# Patient Record
Sex: Female | Born: 1955 | Race: White | Hispanic: No | Marital: Married | Smoking: Never smoker
Health system: Southern US, Community
[De-identification: ages and names within clinical notes are randomized; demographics above are authoritative.]

## PROBLEM LIST (undated history)

## (undated) DIAGNOSIS — K5792 Diverticulitis of intestine, part unspecified, without perforation or abscess without bleeding: Secondary | ICD-10-CM

## (undated) DIAGNOSIS — N289 Disorder of kidney and ureter, unspecified: Secondary | ICD-10-CM

## (undated) DIAGNOSIS — I10 Essential (primary) hypertension: Secondary | ICD-10-CM

## (undated) HISTORY — PX: BREAST EXCISIONAL BIOPSY: SUR124

## (undated) HISTORY — PX: COLON SURGERY: SHX602

---

## 1997-09-02 ENCOUNTER — Ambulatory Visit (HOSPITAL_COMMUNITY): Admission: RE | Admit: 1997-09-02 | Discharge: 1997-09-02 | Payer: Self-pay | Admitting: *Deleted

## 1999-07-18 ENCOUNTER — Other Ambulatory Visit: Admission: RE | Admit: 1999-07-18 | Discharge: 1999-07-18 | Payer: Self-pay | Admitting: Obstetrics & Gynecology

## 2000-08-11 ENCOUNTER — Other Ambulatory Visit: Admission: RE | Admit: 2000-08-11 | Discharge: 2000-08-11 | Payer: Self-pay | Admitting: Obstetrics & Gynecology

## 2001-08-11 ENCOUNTER — Encounter: Payer: Self-pay | Admitting: General Surgery

## 2001-08-11 ENCOUNTER — Other Ambulatory Visit: Admission: RE | Admit: 2001-08-11 | Discharge: 2001-08-11 | Payer: Self-pay | Admitting: Obstetrics & Gynecology

## 2001-08-11 ENCOUNTER — Ambulatory Visit (HOSPITAL_COMMUNITY): Admission: RE | Admit: 2001-08-11 | Discharge: 2001-08-11 | Payer: Self-pay | Admitting: General Surgery

## 2001-09-09 ENCOUNTER — Encounter (INDEPENDENT_AMBULATORY_CARE_PROVIDER_SITE_OTHER): Payer: Self-pay | Admitting: Specialist

## 2001-09-09 ENCOUNTER — Inpatient Hospital Stay (HOSPITAL_COMMUNITY): Admission: RE | Admit: 2001-09-09 | Discharge: 2001-09-16 | Payer: Self-pay | Admitting: General Surgery

## 2002-08-26 ENCOUNTER — Other Ambulatory Visit: Admission: RE | Admit: 2002-08-26 | Discharge: 2002-08-26 | Payer: Self-pay | Admitting: Obstetrics & Gynecology

## 2003-09-07 ENCOUNTER — Other Ambulatory Visit: Admission: RE | Admit: 2003-09-07 | Discharge: 2003-09-07 | Payer: Self-pay | Admitting: Obstetrics & Gynecology

## 2004-10-02 ENCOUNTER — Other Ambulatory Visit: Admission: RE | Admit: 2004-10-02 | Discharge: 2004-10-02 | Payer: Self-pay | Admitting: Obstetrics & Gynecology

## 2006-10-08 ENCOUNTER — Ambulatory Visit: Payer: Self-pay | Admitting: Orthopedic Surgery

## 2010-07-27 NOTE — H&P (Signed)
[facility]  Patient:    Melinda Stevens, Melinda Stevens Visit Number: [id number] MRN: [id number]          Service Type: SUR Location: 3W 6213 01 Attending Physician:  Carson Myrtle Dictated by:   Sheppard Plumber Earlene Plater, M.D. Admit Date:  09/09/2001                           History and Physical  ADMITTING DIAGNOSIS:  Chronic recurrent diverticulitis, pandiverticulosis.  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  She has seen her physician at home.  She has seen a gastroenterologist in the remote past.  She is very stoic.  She is very interested in her life, living, and her work, and she chooses not to be sick nor take time off.  She has had two hospitalizations with diverticulitis in the past 10 years, and she has had approximately four bouts of diverticulitis each year for the past five years treated as an outpatient.  Usually she alters her diet, goes to clear liquids, and takes antibiotics with good resolution in three to five days.  I have only seen the patient twice during these bouts of diverticulitis and treated her personally. She does have a current barium enema done at [facility] read by Dr. Onalee Hua Call, which shows diverticulosis, no evidence of acute diverticulitis. The date of that study was June 23, 2001.  The patient has been scheduled for this surgery for the past three months and for one or another reason has rescheduled the surgery.  She does understand diverticulitis.  She does understand the potential complications of acute diverticulitis, and she understands the surgery for removal of the inflamed portion of the colon.  We have had many discussions about the surgery that she is now ready to undertake.  PAST MEDICAL HISTORY:  Her past history really is unremarkable.  She has had  a vaginal hysterectomy and no other surgery except for a breast mass.  She is a gravida 1, para 1.  MEDICATIONS:  She takes no medications except for multiple vitamin and Fibercon supplement.  ALLERGIES:  She has no allergies.  SOCIAL HISTORY:  She does not smoke.  She does not drink.  FAMILY HISTORY:  Her father is living with diabetes.  Her mother lives, with hypertension.  One brother and two sisters are alive and well.  REVIEW OF SYSTEMS:  She denies constitutional, ENT, cardiovascular, pulmonary problems.  She complains of some stress incontinence with her urine.  Breasts are said to be normal and mammograms current.  She has no joint or muscle pains.  PHYSICAL EXAMINATION:  VITAL SIGNS:  Her vital signs are normal at 98.2, 72, 16, 108/70.  GENERAL:  Her physical exam is remarkable for the fact that she is overweight at 180 pounds, 5 feet 5 inches.  HEENT:  Negative.  NECK:  Free of masses.  CHEST:  Clear to auscultation.  CARDIAC:  The heart rhythm is regular.  BREASTS:  Not examined at this time.  ABDOMEN:  Full, obese, and there are no scars or marks.  Palpation of the left lower quadrant is slightly uncomfortable, and there is no mass effect.  PLAN:  Her laboratory data have been reviewed, her barium enema has been reviewed, and we are all in agreement to proceed with  this surgery, which is removal of her colon involved with diverticulosis and diverticulitis. Dictated by:   Sheppard Plumber Earlene Plater, M.D. Attending Physician:  Carson Myrtle DD:  09/10/01 TD:  09/14/01 Job: [id number]

## 2010-07-27 NOTE — Op Note (Signed)
[facility]  Patient:    Melinda Stevens, Melinda Stevens Visit Number: [id number] MRN: [id number]          Service Type: SUR Location: 3W 4010 01 Attending Physician:  Carson Myrtle Dictated by:   Sheppard Plumber Earlene Plater, M.D. Proc. Date: 09/09/01 Admit Date:  09/09/2001                             Operative Report  PREOPERATIVE DIAGNOSIS:  Chronic recurrent diverticulitis, chronic pancolonic diverticulosis.  POSTOPERATIVE DIAGNOSES: 1. Chronic recurrent diverticulitis, chronic pancolonic diverticulosis. 2. Left ovarian hemorrhagic cyst.  OPERATION: 1. Exploratory laparotomy. 2. Total abdominal colectomy with ileoproctostomy. 3. Excision of left hemorrhagic cyst of the ovary.  SURGEON:  Timothy E. Earlene Plater, M.D.  ASSISTANT:  Donnie Coffin. Samuella Cota, M.D.  ANESTHESIA:  CRNA supervised Ninfa Meeker, M.D.  INDICATION FOR PROCEDURE:  Please see the history and physical for details. Mrs. Marland is operated today at a time of her choice for chronic recurrent diverticulitis and known pancolonic diverticulosis.  She was prepared at home. She was stable upon admission to the preoperative area.  She was evaluated by anesthesia, properly identified and the permit checked and was signed properly.  DESCRIPTION OF PROCEDURE:  She was taken to the operating room, placed supine, general endotracheal anesthesia administered.  A nasogastric tube inserted. Foley catheter inserted.  PAS hose applied.  The abdomen was prepped and draped in the usual fashion.  A short lower midline incision was used.  The abdominal cavity was entered.  There were no apparent adhesions.  The small bowel was inspected and as we began to run the small bowel, it was apparent the distal small bowel was densely adherent to the vaginal cuff and pelvic area.  This was carefully dissected by sharp dissection and the small bowel delivered into the wound.  This was the distal ileum.  The bowel was uninjured and  intact.  In addition, there were a number of other adhesions in the pelvic, mainly acute filmy adhesions of the sigmoid colon to the ovary and pelvic sidewalls and dense chronic adhesions of the rectosigmoid to the pelvic sidewall and left ovary.  The left ovary contained a large hemorrhagic cyst that was in fact bleeding prior to any dissection.  That cyst was inspected, excised.  The tissue submitted for pathology and the cyst wall oversewn with a running 3-0 Vicryl suture.  The dense adhesions of the rectosigmoid were taken down sharply.  The sigmoid colon and descending colon had the appearance and feel of a burned out or so called stove pipe colon of colitis or chronic diverticulitis.  We then extended the incision to the right and above the umbilicus in order to explore the colon further.  By palpation this involvement of the colon extended to the splenic flexure.  We began to take down the lateral peritoneal attachments of the left colon up to the splenic flexure, around the splenic flexure and were able to then further palpate other diverticula of the transfer colon.  Therefore, we extended the incision again to a full long midline incision.  The colon was then further explored. Large wide mouth inspissated diverticula were present in the mid transverse colon, the hepatic flexure and the right colon.  We then proceeded with the dissection, dividing the layers of the omentum superior to the colon and delivered the splenic flexure well into the wound.  We intended to divide the colon at the mid  transverse area so that the mid transverse chronic diverticula could be removed.  We divided the colon at that point between clamps.  We divided the rectum at its upper portion between clamps and then further were able to dissect and deliver the left colon, splenic flexure and transverse colon into the wound.  As we did that, it became apparent that the proximal transverse colon and hepatic  flexure was not going to reach down into the pelvic for the anastomosis, so we divided the superior and inferior peritoneum of the transverse and hepatic flexure in order to gain some flexibility for the anastomosis.  As we did so, the short ascending colon and the foreshortened mesentry of the transverse colon made it apparent that the entire colon had been involved at some time with diverticulitis and that the diverticula of the right colon also were at risk being wide mouthed and full of inspissated bowel that a total abdominal colectomy was in order.  As well only a short portion of the ascending colon could have been salvaged for delivery into the pelvis for the anastomosis.  So we made that intraoperative decision and continued to divide the peritoneal attachments of the entire colon.  We then divided the ileum at the ileocecal junction between clamps which of course included the appendix with the cecum and then proceeded to remove the entire colon by dividing the mesentry between clamps and passing the entire colon off the field in two specimens.  The first being the sigmoid, descending and transverse colon.  The second specimen being the remainder of transverse and ascending colon.  These were sent fresh to the laboratory and carefully identified on the phone.  This left then, the terminal ileum which was well mobile in the right lower quadrant.  All areas of the mesentry were carefully checked as well the splenic bed.  No bleeding was apparent. The areas were irrigated and then packed off.  The ileum easily reached the upper rectum and the clamps were removed.  There was no spillage and an open end-to-end anastomosis was created between the ileum and the upper rectum in a one layer hand sewn fashion with 3-0 interrupted silk.  The small mesenteric defect was closed with interrupted silk.  Again the pelvis was carefully inspected, irrigated.  All areas of dissection were checked.   Nasogastric tube was positioned and the procedure was complete.  All sponge and instrument counts were correct.  Estimated blood loss at this point was between 300 and  400 cc.  The patient was stable and no replacement was given except for crystalloid fluid.  With the counts correct, the remaining viscera were placed in their usual and anatomic position.  The omentum was drawn down over the small bowel and the abdomen was closed in a single layer with #1 PDS sutures. The subcutaneous was copiously irrigated and the skin closed with wide skin staples. Final counts were correct.  The patient was stable and she was extubated and removed to the recovery room in good condition. Dictated by:   Sheppard Plumber Earlene Plater, M.D. Attending Physician:  Carson Myrtle DD:  09/09/01 TD:  09/13/01 Job: [id number]

## 2010-07-27 NOTE — Discharge Summary (Signed)
[facility]  Patient:    Melinda Stevens, Melinda Stevens Visit Number: [id number] MRN: [id number]          Service Type: SUR Location: 3W 6213 01 Attending Physician:  Carson Myrtle Dictated by:   Sheppard Plumber Earlene Plater, M.D. Admit Date:  09/09/2001 Discharge Date: 09/16/2001                             Discharge Summary  DISCHARGE DIAGNOSES: 1. Pandiverticulosis. 2. Diverticulitis.  PROCEDURE:  Total abdominal colectomy with ileoproctostomy on September 09, 2001.  HISTORY OF PRESENT ILLNESS:  For details of admission, please see the enclosed notes.  HOSPITAL COURSE:  The patient was prepared for the surgery at home.  She was seen and evaluated on the day of surgery.  She was taken to the operating room where a total abdominal colectomy and ileoproctostomy was accomplished because of pandiverticulosis and diverticulitis.  She had an excellent postoperative recuperation without the first complication.  She progressed well physically, as well as mentally and emotionally.  Her bowel action returned on the third and fourth day.  She was able to tolerate a diet advancing to normal with the expected amount of stool action and diarrhea.  She had no fever, no complications and no infections.  The wound remained primary.  Staples were out.  Steri-Strips were applied.  Careful instructions were given to the patient in regards to activity, diet and regulation of diarrhea.  Her final pathology report did show pandiverticulosis and acute diverticulitis.  She was given Percocet 5 mg, #36 and will be followed closely in the hospital.  LABORATORY DATA AND X-RAY FINDINGS:  Preoperative hemoglobin of 14, postoperative 13.  Other laboratory data satisfactory with slight elevation of glucose followed with IV fluids. Dictated by:   Sheppard Plumber Earlene Plater, M.D. Attending Physician:  Carson Myrtle DD:  09/16/01 TD:  09/19/01 Job: [id number]

## 2013-08-23 ENCOUNTER — Other Ambulatory Visit: Payer: Self-pay | Admitting: Obstetrics & Gynecology

## 2013-09-21 ENCOUNTER — Other Ambulatory Visit: Payer: Self-pay | Admitting: Obstetrics & Gynecology

## 2014-08-15 ENCOUNTER — Other Ambulatory Visit: Payer: Self-pay | Admitting: Obstetrics & Gynecology

## 2014-08-16 LAB — CYTOLOGY - PAP

## 2016-05-02 ENCOUNTER — Emergency Department (HOSPITAL_COMMUNITY): Payer: BLUE CROSS/BLUE SHIELD

## 2016-05-02 ENCOUNTER — Emergency Department (HOSPITAL_COMMUNITY)
Admission: EM | Admit: 2016-05-02 | Discharge: 2016-05-02 | Disposition: A | Discharge status: Home / Self Care | Payer: BLUE CROSS/BLUE SHIELD | Attending: Emergency Medicine | Admitting: Emergency Medicine

## 2016-05-02 ENCOUNTER — Encounter (HOSPITAL_COMMUNITY): Payer: Self-pay | Admitting: Emergency Medicine

## 2016-05-02 DIAGNOSIS — J181 Lobar pneumonia, unspecified organism: Secondary | ICD-10-CM | POA: Diagnosis not present

## 2016-05-02 DIAGNOSIS — R1011 Right upper quadrant pain: Secondary | ICD-10-CM | POA: Diagnosis present

## 2016-05-02 DIAGNOSIS — I10 Essential (primary) hypertension: Secondary | ICD-10-CM | POA: Diagnosis not present

## 2016-05-02 DIAGNOSIS — Z7982 Long term (current) use of aspirin: Secondary | ICD-10-CM | POA: Insufficient documentation

## 2016-05-02 DIAGNOSIS — J189 Pneumonia, unspecified organism: Secondary | ICD-10-CM

## 2016-05-02 HISTORY — DX: Diverticulitis of intestine, part unspecified, without perforation or abscess without bleeding: K57.92

## 2016-05-02 HISTORY — DX: Essential (primary) hypertension: I10

## 2016-05-02 HISTORY — DX: Disorder of kidney and ureter, unspecified: N28.9

## 2016-05-02 LAB — COMPREHENSIVE METABOLIC PANEL
ALT: 34 U/L (ref 14–54)
AST: 25 U/L (ref 15–41)
Albumin: 3.6 g/dL (ref 3.5–5.0)
Alkaline Phosphatase: 45 U/L (ref 38–126)
Anion gap: 8 (ref 5–15)
BUN: 9 mg/dL (ref 6–20)
CHLORIDE: 104 mmol/L (ref 101–111)
CO2: 27 mmol/L (ref 22–32)
CREATININE: 0.79 mg/dL (ref 0.44–1.00)
Calcium: 8.6 mg/dL — ABNORMAL LOW (ref 8.9–10.3)
GFR calc Af Amer: >60 mL/min (ref >60)
GFR calc non Af Amer: >60 mL/min (ref >60)
Glucose, Bld: 96 mg/dL (ref 65–99)
Potassium: 3.6 mmol/L (ref 3.5–5.1)
SODIUM: 139 mmol/L (ref 135–145)
Total Bilirubin: 0.5 mg/dL (ref 0.3–1.2)
Total Protein: 6 g/dL — ABNORMAL LOW (ref 6.5–8.1)

## 2016-05-02 LAB — CBC WITH DIFFERENTIAL/PLATELET
Basophils Absolute: 0.1 10*3/uL (ref 0.0–0.1)
Basophils Relative: 0 %
EOS ABS: 0.3 10*3/uL (ref 0.0–0.7)
EOS PCT: 2 %
HCT: 38.8 % (ref 36.0–46.0)
HEMOGLOBIN: 12.8 g/dL (ref 12.0–15.0)
LYMPHS ABS: 2.1 10*3/uL (ref 0.7–4.0)
Lymphocytes Relative: 17 %
MCH: 29.4 pg (ref 26.0–34.0)
MCHC: 33 g/dL (ref 30.0–36.0)
MCV: 89.2 fL (ref 78.0–100.0)
MONO ABS: 1 10*3/uL (ref 0.1–1.0)
MONOS PCT: 8 %
NEUTROS PCT: 73 %
Neutro Abs: 8.7 10*3/uL — ABNORMAL HIGH (ref 1.7–7.7)
Platelets: 211 10*3/uL (ref 150–400)
RBC: 4.35 MIL/uL (ref 3.87–5.11)
RDW: 13.7 % (ref 11.5–15.5)
WBC: 12.1 10*3/uL — ABNORMAL HIGH (ref 4.0–10.5)

## 2016-05-02 LAB — URINALYSIS, ROUTINE W REFLEX MICROSCOPIC
Bacteria, UA: NONE SEEN
Bilirubin Urine: NEGATIVE
GLUCOSE, UA: NEGATIVE mg/dL
Hgb urine dipstick: NEGATIVE
Ketones, ur: NEGATIVE mg/dL
Nitrite: NEGATIVE
PROTEIN: NEGATIVE mg/dL
Specific Gravity, Urine: 1.029 (ref 1.005–1.030)
pH: 6 (ref 5.0–8.0)

## 2016-05-02 LAB — LIPASE, BLOOD: Lipase: 24 U/L (ref 11–51)

## 2016-05-02 MED ORDER — HYDROMORPHONE HCL 2 MG/ML IJ SOLN
0.5000 mg | Freq: Once | INTRAMUSCULAR | Status: AC
  Administered 2016-05-02: 0.5 mg via INTRAVENOUS
  Filled 2016-05-02: qty 1

## 2016-05-02 MED ORDER — AZITHROMYCIN 250 MG PO TABS: 250.0000 mg | ORAL_TABLET | Freq: Every day | ORAL | 0 refills | Status: DC

## 2016-05-02 MED ORDER — IOPAMIDOL (ISOVUE-370) INJECTION 76%
INTRAVENOUS | Status: AC
  Administered 2016-05-02: 100 mL via INTRAVENOUS
  Filled 2016-05-02: qty 100

## 2016-05-02 MED ORDER — ONDANSETRON HCL 4 MG/2ML IJ SOLN
4.0000 mg | Freq: Once | INTRAMUSCULAR | Status: AC
  Administered 2016-05-02: 4 mg via INTRAVENOUS
  Filled 2016-05-02: qty 2

## 2016-05-02 MED ORDER — SODIUM CHLORIDE 0.9 % IV SOLN
Freq: Once | INTRAVENOUS | Status: AC
  Administered 2016-05-02: 11:00:00 via INTRAVENOUS

## 2016-05-02 NOTE — ED Triage Notes (Signed)
Seen  At  [facility] last night dx with renal mass after c/o abd pain x 3 days  Sent home with antiobiotic and they wanted to  Admit her but she wanted to be at [facility]

## 2016-05-02 NOTE — ED Provider Notes (Signed)
[place] DEPT Provider Note   CSN: [id number] Arrival date & time: 05/02/16  [id number]     History   Chief Complaint Chief Complaint  Patient presents with  . Abdominal Pain    HPI Melinda Stevens is a 61 y.o. female.  HPI 61 year old female who presents with right upper quadrant abdominal pain. She has a history of perforated diverticulitis status post colectomy, CK D, hypertension, abdominal hysterectomy, and tubal ligation. States sudden onset of right upper quadrant abdominal pain and flank pain 3 days ago, has been gradually worsening, and constant. It is associated with fever with MAXIMUM TEMPERATURE of 103 Fahrenheit for which she has been taking Tylenol for, to good effect. Not associated with vomiting, diarrhea, dysuria or urinary frequency. She has had mild cough, unchanged from her chronic cough and no shortness of breath. Past Medical History:  Diagnosis Date  . Diverticulitis   . Hypertension   . Renal disorder     There are no active problems to display for this patient.   No past surgical history on file.  OB History    No data available       Home Medications    Prior to Admission medications   Medication Sig Start Date End Date Taking? Authorizing Provider  aspirin EC 81 MG tablet Take 81 mg by mouth daily.   Yes Historical Provider, MD  lisinopril-hydrochlorothiazide (PRINZIDE,ZESTORETIC) 20-12.5 MG tablet Take 1 tablet by mouth daily.   Yes Historical Provider, MD  azithromycin (ZITHROMAX) 250 MG tablet Take 1 tablet (250 mg total) by mouth daily. Take first 2 tablets together, then 1 every day until finished. 05/02/16   Forde Dandy, MD    Family History No family history on file.  Social History Social History  Substance Use Topics  . Smoking status: Never Smoker  . Smokeless tobacco: Never Used  . Alcohol use Not on file     Allergies   Patient has no known allergies.   Review of Systems Review of Systems 10/14 systems reviewed and  are negative other than those stated in the HPI   Physical Exam Updated Vital Signs BP 130/72   Pulse 89   Temp 100.1 F (37.8 C) (Oral)   Resp 18   SpO2 94%   Physical Exam Physical Exam  Nursing note and vitals reviewed. Constitutional: Well developed, well nourished, non-toxic, and in no acute distress Head: Normocephalic and atraumatic.  Mouth/Throat: Oropharynx is clear and moist.  Neck: Normal range of motion. Neck supple.  Cardiovascular: Normal rate and regular rhythm.   Pulmonary/Chest: Effort normal and breath sounds normal.  Abdominal: Soft. No distension. There is RUQ and right flank tenderness. There is no rebound and no guarding.  Musculoskeletal: Normal range of motion.  Neurological: Alert, no facial droop, fluent speech, moves all extremities symmetrically Skin: Skin is warm and dry.  Psychiatric: Cooperative   ED Treatments / Results  Labs (all labs ordered are listed, but only abnormal results are displayed) Labs Reviewed  CBC WITH DIFFERENTIAL/PLATELET - Abnormal; Notable for the following:       Result Value   WBC 12.1 (*)    Neutro Abs 8.7 (*)    All other components within normal limits  URINALYSIS, ROUTINE W REFLEX MICROSCOPIC - Abnormal; Notable for the following:    APPearance HAZY (*)    Leukocytes, UA MODERATE (*)    Squamous Epithelial / LPF 0-5 (*)    Non Squamous Epithelial 6-30 (*)    All other components  within normal limits  COMPREHENSIVE METABOLIC PANEL - Abnormal; Notable for the following:    Calcium 8.6 (*)    Total Protein 6.0 (*)    All other components within normal limits  LIPASE, BLOOD    EKG  EKG Interpretation None       Radiology Dg Chest 2 View  Result Date: 05/02/2016 CLINICAL DATA:  Shortness of breath and cough over the last 3 days. EXAM: CHEST  2 VIEW COMPARISON:  06/13/2015 FINDINGS: Mild cardiomegaly. Aortic atherosclerosis. Chronic interstitial lung disease. Patchy infiltrate/atelectasis at the lung  bases right more than left not seen previously, consistent with mild pneumonia. No effusions. Bony structures unremarkable. IMPRESSION: Background pattern of chronic lung disease. New patchy density at the lung bases right more than left consistent with basilar pneumonia. Electronically Signed   By: Nelson Chimes M.D.   On: 05/02/2016 11:17   Ct Angio Chest Pe W And/or Wo Contrast  Result Date: 05/02/2016 CLINICAL DATA:  Chest pain and shortness of breath EXAM: CT ANGIOGRAPHY CHEST WITH CONTRAST TECHNIQUE: Multidetector CT imaging of the chest was performed using the standard protocol during bolus administration of intravenous contrast. Multiplanar CT image reconstructions and MIPs were obtained to evaluate the vascular anatomy. CONTRAST:  80 mL Isovue 370 nonionic COMPARISON:  Chest radiograph May 02, 2016 FINDINGS: Cardiovascular: There is no demonstrable pulmonary embolus. There is no thoracic aortic aneurysm or dissection. Visualized great vessels appear unremarkable. There is no appreciable pericardial thickening. Mediastinum/Nodes: Visualized thyroid appears normal. There are multiple subcentimeter mediastinal lymph nodes. There is a right hilar lymph node measuring 1.4 x 1.1 cm. There are several small sub- carinal lymph nodes. The largest of these sub- carinal lymph nodes measures 1.4 x 1.0 cm. There is an adjacent lymph node in the sub- carinal region measuring 1.2 x 1.0 cm. Lungs/Pleura: There is patchy airspace consolidation in the right middle lobe and in the left lower lobe in the anterior, lateral, and posterior segments, felt to represent foci of pneumonia. There is atelectasis with probable early pneumonia in the posterior right base as well. There is no pleural effusion or pleural thickening evident. Upper Abdomen: There is hepatic steatosis. Visualized upper abdominal structures otherwise appear unremarkable. Musculoskeletal: There is degenerative change in the thoracic spine. There are no  blastic or lytic bone lesions. Review of the MIP images confirms the above findings. IMPRESSION: No demonstrable pulmonary embolus. Patchy areas of pneumonia in the right middle lobe and left lower lobe and to a lesser extent in the right lower lobe posteriorly. Several mildly prominent lymph nodes present, likely of reactive etiology. Hepatic steatosis. Electronically Signed   By: Lowella Grip III M.D.   On: 05/02/2016 15:13   US Abdomen Limited Ruq  Result Date: 05/02/2016 CLINICAL DATA:  Right upper quadrant abdominal pain for the past 3 days. EXAM: US ABDOMEN LIMITED - RIGHT UPPER QUADRANT COMPARISON:  Abdominopelvic CT scan of May 01, 2016 FINDINGS: Gallbladder: The gallbladder is adequately distended with no evidence of stones, wall thickening, or pericholecystic fluid. There is no positive sonographic Murphy's sign Common bile duct: Diameter: 5 mm Liver: The hepatic echotexture is mildly increased diffusely. There is no focal mass or ductal dilation. The surface contour of the liver is smooth. IMPRESSION: No hepatic mass is observed on today's study. Increased hepatic echotexture is consistent with fatty infiltrative change. Normal appearance of the gallbladder and common bile duct. If there are clinical concerns of gallbladder dysfunction, a nuclear medicine hepatobiliary scan with gallbladder ejection fraction  determination may be useful. Electronically Signed   By: David  Martinique M.D.   On: 05/02/2016 11:37    Procedures Procedures (including critical care time)  Medications Ordered in ED Medications  HYDROmorphone (DILAUDID) injection 0.5 mg (0.5 mg Intravenous Given 05/02/16 1102)  ondansetron (ZOFRAN) injection 4 mg (4 mg Intravenous Given 05/02/16 1102)  0.9 %  sodium chloride infusion ( Intravenous New Bag/Given 05/02/16 1102)  iopamidol (ISOVUE-370) 76 % injection (100 mLs Intravenous Contrast Given 05/02/16 1445)     Initial Impression / Assessment and Plan / ED Course  I  have reviewed the triage vital signs and the nursing notes.  Pertinent labs & imaging results that were available during my care of the patient were reviewed by me and considered in my medical decision making (see chart for details).     Records are obtained from [facility]. She had blood work concerning for leukocytosis of 14.6. She has a CT abdomen and pelvis that showed a 1.4 cm mass in the right kidney and 1.6 solid mass in the right adrenal gland, but no other acute processes. Lower lungs showed atelectasis without pneumonia. No evidence of hepatobiliary disease other then steatosis and enlarged liver.  She is afebrile here in the emergency Department, well appearing. Pain not fully reproduced on exam but did have some right upper quadrant tenderness. Right upper quadrant ultrasound visualized and shows no evidence of gallstones, choledocholithiasis, or signs of infection. Her chest x-ray showed question of pneumonia, but her CT abdomen pelvis did not show evidence of pneumonia yesterday which makes this somewhat suspicious. I did do CT angiogram of the chest to rule out PE and to evaluate for true pneumonia. This is visualized does not show PE but she does have right middle, mild right lower, and right lower lobe pneumonia. She is able to ambulate throughout the department without significant hypoxia and without significant work of breathing. Leukocytosis improved to 12 today. I do not necessarily think she'll she requires inpatient admission. 30 been taking Augmentin at home which she will continue. We'll add on azithromycin. Strict return and follow-up instructions are reviewed. She expressed understanding of all discharge instructions, and felt comfortable to plan of care  Final Clinical Impressions(s) / ED Diagnoses   Final diagnoses:  RUQ abdominal pain  Lobar pneumonia ([place])  Community acquired pneumonia of right middle lobe of lung ([place])    New Prescriptions New Prescriptions     AZITHROMYCIN (ZITHROMAX) 250 MG TABLET    Take 1 tablet (250 mg total) by mouth daily. Take first 2 tablets together, then 1 every day until finished.     Forde Dandy, MD 05/02/16 [phone]

## 2016-05-02 NOTE — Discharge Instructions (Signed)
Please take Augmentin that was prescribed for you yesterday for your pneumonia. Azithromycin prescribed today is also for pneumonia.   Return for worsening symptoms, including persistent fever, difficulty breathing, confusion, passing out, or any other symptoms concerning to you.  Your CT abdomen[facility]pelvis from [facility] noted that you have a 1.4 cm mass int he right kidney and 1.6 cm solid mass in the right adrenal gland. They recommended that you have follow-up MRI with your primary care doctor. Please let your primary care doctor know about that, but this is not what is causing your pain.

## 2016-05-02 NOTE — ED Notes (Signed)
While ambulating pt, pt's O2 result ranged between 90%-94%. Pt's pulse was 106-110. Pt asked by this Tech if she was having trouble breathing. Pt said, "No". Pt did say that she was not inhaling deeply because of her pain. Informed Dr. Oleta Mouse.

## 2016-09-19 ENCOUNTER — Other Ambulatory Visit: Payer: Self-pay | Admitting: Urology

## 2016-09-19 DIAGNOSIS — D3000 Benign neoplasm of unspecified kidney: Secondary | ICD-10-CM

## 2016-11-25 ENCOUNTER — Ambulatory Visit (HOSPITAL_COMMUNITY)
Admission: RE | Admit: 2016-11-25 | Discharge: 2016-11-25 | Disposition: A | Discharge status: Home / Self Care | Payer: Commercial Managed Care - PPO | Source: Ambulatory Visit | Attending: Urology | Admitting: Urology

## 2016-11-25 ENCOUNTER — Encounter (HOSPITAL_COMMUNITY): Payer: Self-pay | Admitting: Radiology

## 2016-11-25 DIAGNOSIS — R160 Hepatomegaly, not elsewhere classified: Secondary | ICD-10-CM | POA: Diagnosis not present

## 2016-11-25 DIAGNOSIS — K760 Fatty (change of) liver, not elsewhere classified: Secondary | ICD-10-CM | POA: Insufficient documentation

## 2016-11-25 DIAGNOSIS — D3000 Benign neoplasm of unspecified kidney: Secondary | ICD-10-CM

## 2016-11-25 DIAGNOSIS — D3001 Benign neoplasm of right kidney: Secondary | ICD-10-CM | POA: Diagnosis not present

## 2016-11-25 LAB — POCT I-STAT CREATININE: CREATININE: 0.7 mg/dL (ref 0.44–1.00)

## 2016-11-25 MED ORDER — GADOBENATE DIMEGLUMINE 529 MG/ML IV SOLN
20.0000 mL | Freq: Once | INTRAVENOUS | Status: AC | PRN
  Administered 2016-11-25: 20 mL via INTRAVENOUS

## 2017-04-30 ENCOUNTER — Other Ambulatory Visit: Payer: Self-pay | Admitting: Urology

## 2017-04-30 DIAGNOSIS — C642 Malignant neoplasm of left kidney, except renal pelvis: Secondary | ICD-10-CM

## 2017-06-11 ENCOUNTER — Ambulatory Visit
Admission: RE | Admit: 2017-06-11 | Discharge: 2017-06-11 | Disposition: A | Discharge status: Home / Self Care | Payer: Commercial Managed Care - PPO | Source: Ambulatory Visit | Attending: Urology | Admitting: Urology

## 2017-06-11 DIAGNOSIS — C642 Malignant neoplasm of left kidney, except renal pelvis: Secondary | ICD-10-CM

## 2017-06-11 MED ORDER — GADOBENATE DIMEGLUMINE 529 MG/ML IV SOLN
19.0000 mL | Freq: Once | INTRAVENOUS | Status: AC | PRN
  Administered 2017-06-11: 19 mL via INTRAVENOUS

## 2017-09-25 ENCOUNTER — Other Ambulatory Visit: Payer: Self-pay | Admitting: Obstetrics & Gynecology

## 2017-09-25 DIAGNOSIS — N63 Unspecified lump in unspecified breast: Secondary | ICD-10-CM

## 2017-09-29 ENCOUNTER — Other Ambulatory Visit: Payer: Commercial Managed Care - PPO

## 2017-09-30 ENCOUNTER — Ambulatory Visit
Admission: RE | Admit: 2017-09-30 | Discharge: 2017-09-30 | Disposition: A | Discharge status: Home / Self Care | Payer: Commercial Managed Care - PPO | Source: Ambulatory Visit | Attending: Obstetrics & Gynecology | Admitting: Obstetrics & Gynecology

## 2017-09-30 ENCOUNTER — Other Ambulatory Visit: Payer: Self-pay | Admitting: Obstetrics & Gynecology

## 2017-09-30 DIAGNOSIS — N63 Unspecified lump in unspecified breast: Secondary | ICD-10-CM

## 2017-10-03 ENCOUNTER — Ambulatory Visit
Admission: RE | Admit: 2017-10-03 | Discharge: 2017-10-03 | Disposition: A | Discharge status: Home / Self Care | Payer: Commercial Managed Care - PPO | Source: Ambulatory Visit | Attending: Obstetrics & Gynecology | Admitting: Obstetrics & Gynecology

## 2017-10-03 DIAGNOSIS — N63 Unspecified lump in unspecified breast: Secondary | ICD-10-CM

## 2017-11-05 ENCOUNTER — Emergency Department (HOSPITAL_COMMUNITY): Payer: Commercial Managed Care - PPO

## 2017-11-05 ENCOUNTER — Encounter (HOSPITAL_COMMUNITY): Payer: Self-pay

## 2017-11-05 ENCOUNTER — Emergency Department (HOSPITAL_COMMUNITY)
Admission: EM | Admit: 2017-11-05 | Discharge: 2017-11-05 | Disposition: A | Discharge status: Home / Self Care | Payer: Commercial Managed Care - PPO | Attending: Emergency Medicine | Admitting: Emergency Medicine

## 2017-11-05 DIAGNOSIS — A049 Bacterial intestinal infection, unspecified: Secondary | ICD-10-CM

## 2017-11-05 DIAGNOSIS — I10 Essential (primary) hypertension: Secondary | ICD-10-CM | POA: Diagnosis not present

## 2017-11-05 DIAGNOSIS — Z79899 Other long term (current) drug therapy: Secondary | ICD-10-CM | POA: Insufficient documentation

## 2017-11-05 DIAGNOSIS — Z7982 Long term (current) use of aspirin: Secondary | ICD-10-CM | POA: Insufficient documentation

## 2017-11-05 DIAGNOSIS — N390 Urinary tract infection, site not specified: Secondary | ICD-10-CM | POA: Diagnosis not present

## 2017-11-05 DIAGNOSIS — R1012 Left upper quadrant pain: Secondary | ICD-10-CM | POA: Diagnosis present

## 2017-11-05 LAB — URINALYSIS, ROUTINE W REFLEX MICROSCOPIC
Bilirubin Urine: NEGATIVE
GLUCOSE, UA: NEGATIVE mg/dL
HGB URINE DIPSTICK: NEGATIVE
Ketones, ur: NEGATIVE mg/dL
NITRITE: NEGATIVE
PROTEIN: NEGATIVE mg/dL
Specific Gravity, Urine: 1.011 (ref 1.005–1.030)
pH: 6 (ref 5.0–8.0)

## 2017-11-05 LAB — COMPREHENSIVE METABOLIC PANEL
ALT: 36 U/L (ref 0–44)
ANION GAP: 9 (ref 5–15)
AST: 27 U/L (ref 15–41)
Albumin: 4.3 g/dL (ref 3.5–5.0)
Alkaline Phosphatase: 57 U/L (ref 38–126)
BILIRUBIN TOTAL: 0.7 mg/dL (ref 0.3–1.2)
BUN: 12 mg/dL (ref 8–23)
CO2: 30 mmol/L (ref 22–32)
Calcium: 9.1 mg/dL (ref 8.9–10.3)
Chloride: 100 mmol/L (ref 98–111)
Creatinine, Ser: 0.91 mg/dL (ref 0.44–1.00)
GFR calc Af Amer: >60 mL/min (ref >60)
GFR calc non Af Amer: >60 mL/min (ref >60)
Glucose, Bld: 108 mg/dL — ABNORMAL HIGH (ref 70–99)
POTASSIUM: 4 mmol/L (ref 3.5–5.1)
Sodium: 139 mmol/L (ref 135–145)
TOTAL PROTEIN: 7.5 g/dL (ref 6.5–8.1)

## 2017-11-05 LAB — CBC
HEMATOCRIT: 46.2 % — AB (ref 36.0–46.0)
Hemoglobin: 15.3 g/dL — ABNORMAL HIGH (ref 12.0–15.0)
MCH: 29.9 pg (ref 26.0–34.0)
MCHC: 33.1 g/dL (ref 30.0–36.0)
MCV: 90.4 fL (ref 78.0–100.0)
Platelets: 236 10*3/uL (ref 150–400)
RBC: 5.11 MIL/uL (ref 3.87–5.11)
RDW: 13.3 % (ref 11.5–15.5)
WBC: 13.9 10*3/uL — ABNORMAL HIGH (ref 4.0–10.5)

## 2017-11-05 LAB — LIPASE, BLOOD: LIPASE: 31 U/L (ref 11–51)

## 2017-11-05 MED ORDER — CIPROFLOXACIN HCL 250 MG PO TABS
500.0000 mg | ORAL_TABLET | Freq: Once | ORAL | Status: AC
  Administered 2017-11-05: 500 mg via ORAL
  Filled 2017-11-05: qty 2

## 2017-11-05 MED ORDER — METRONIDAZOLE 500 MG PO TABS: 500.0000 mg | ORAL_TABLET | Freq: Two times a day (BID) | ORAL | 0 refills | Status: DC

## 2017-11-05 MED ORDER — METRONIDAZOLE 500 MG PO TABS
500.0000 mg | ORAL_TABLET | Freq: Once | ORAL | Status: AC
  Administered 2017-11-05: 500 mg via ORAL
  Filled 2017-11-05: qty 1

## 2017-11-05 MED ORDER — ONDANSETRON 4 MG PO TBDP
4.0000 mg | ORAL_TABLET | Freq: Once | ORAL | Status: AC
  Administered 2017-11-05: 4 mg via ORAL
  Filled 2017-11-05: qty 1

## 2017-11-05 MED ORDER — IOPAMIDOL (ISOVUE-300) INJECTION 61%
100.0000 mL | Freq: Once | INTRAVENOUS | Status: AC | PRN
  Administered 2017-11-05: 100 mL via INTRAVENOUS

## 2017-11-05 MED ORDER — DICYCLOMINE HCL 20 MG PO TABS: 20.0000 mg | ORAL_TABLET | Freq: Two times a day (BID) | ORAL | 0 refills | Status: DC | PRN

## 2017-11-05 MED ORDER — ONDANSETRON HCL 4 MG/2ML IJ SOLN
4.0000 mg | Freq: Once | INTRAMUSCULAR | Status: DC
  Filled 2017-11-05: qty 2

## 2017-11-05 MED ORDER — CIPROFLOXACIN HCL 500 MG PO TABS: 500.0000 mg | ORAL_TABLET | Freq: Two times a day (BID) | ORAL | 0 refills | Status: DC

## 2017-11-05 MED ORDER — ONDANSETRON HCL 4 MG PO TABS: 4.0000 mg | ORAL_TABLET | Freq: Four times a day (QID) | ORAL | 0 refills | Status: DC | PRN

## 2017-11-05 NOTE — ED Triage Notes (Signed)
Pt reports abdominal pain  And bloating that began 4 hours ago. Pain below umbilicus. Pt reports normal BM approx 2 hrs ago. Denies N/V/D

## 2017-11-05 NOTE — ED Provider Notes (Signed)
[facility] EMERGENCY DEPARTMENT Provider Note   CSN: [id number] Arrival date & time: 11/05/17  1655     History   Chief Complaint Chief Complaint  Patient presents with  . Abdominal Pain    HPI Melinda Stevens is a 62 y.o. female.  HPI Patient presents with abdominal pain, distention and nausea starting 4 hours prior to presentation.  Had a normal bowel movement today.  Endorses a low-grade fever but no chills.  Pain is mostly located in the left side of the abdomen.  Has history of diverticulitis and colectomy.  Denies any urinary symptoms. Past Medical History:  Diagnosis Date  . Diverticulitis   . Hypertension   . Renal disorder     There are no active problems to display for this patient.   Past Surgical History:  Procedure Laterality Date  . BREAST EXCISIONAL BIOPSY Left   . 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 Authorizing Provider  acetaminophen (TYLENOL) 500 MG tablet Take 500 mg by mouth every 6 (six) hours as needed for mild pain or moderate pain.   Yes [provider]  aspirin EC 81 MG tablet Take 81 mg by mouth daily.   Yes [provider]  lisinopril-hydrochlorothiazide (PRINZIDE,ZESTORETIC) 20-12.5 MG tablet Take 1 tablet by mouth daily.   Yes [provider]  Multiple Vitamin (MULTIVITAMIN WITH MINERALS) TABS tablet Take 1 tablet by mouth daily.   Yes [provider]  Multiple Vitamins-Minerals (ICAPS) TABS Take 1 tablet by mouth daily.   Yes [provider]  ciprofloxacin (CIPRO) 500 MG tablet Take 1 tablet (500 mg total) by mouth 2 (two) times daily. One po bid x 7 days 11/05/17   Julianne Rice, MD  dicyclomine (BENTYL) 20 MG tablet Take 1 tablet (20 mg total) by mouth 2 (two) times daily as needed for spasms. 11/05/17   Julianne Rice, MD    metroNIDAZOLE (FLAGYL) 500 MG tablet Take 1 tablet (500 mg total) by mouth 2 (two) times daily. One po bid x 7 days 11/05/17   Julianne Rice, MD  ondansetron [facility]) 4 MG tablet Take 1 tablet (4 mg total) by mouth every 6 (six) hours as needed for nausea or vomiting. 11/05/17   Julianne Rice, MD    Family History Family History  Problem Relation Age of Onset  . Breast cancer Sister   . Breast cancer Maternal Aunt     Social History Social History   Tobacco Use  . Smoking status: Never Smoker  . Smokeless tobacco: Never Used  Substance Use Topics  . Alcohol use: Not Currently  . Drug use: Not Currently     Allergies   Patient has no known allergies.   Review of Systems Review of Systems  Constitutional: Positive for fever. Negative for chills.  Respiratory: Negative for cough and shortness of breath.   Cardiovascular: Negative for chest pain.  Gastrointestinal: Positive for abdominal distention, abdominal pain and nausea. Negative for blood in stool, constipation, diarrhea and vomiting.  Genitourinary: Negative for dysuria, flank pain, frequency, hematuria and pelvic pain.  Musculoskeletal: Positive for back pain. Negative for myalgias, neck pain and neck stiffness.  Skin: Negative for rash and wound.  Neurological: Negative for dizziness, weakness, light-headedness, numbness and headaches.  All other systems reviewed and  are negative.    Physical Exam Updated Vital Signs BP 140/66   Pulse 83   Temp 98.2 F (36.8 C) (Oral)   Resp 16   Ht 5\' 5"  (1.651 m)   Wt 106.6 kg   SpO2 100%   BMI 39.11 kg/m   Physical Exam  Constitutional: She is oriented to person, place, and time. She appears well-developed and well-nourished. No distress.  HENT:  Head: Normocephalic and atraumatic.  Mouth/Throat: Oropharynx is clear and moist. No oropharyngeal exudate.  Eyes: Pupils are equal, round, and reactive to light. EOM are normal.  Neck: Normal range of motion. Neck  supple.  Cardiovascular: Normal rate and regular rhythm. Exam reveals no gallop and no friction rub.  No murmur heard. Pulmonary/Chest: Effort normal and breath sounds normal. No stridor. No respiratory distress. She has no wheezes. She has no rales. She exhibits no tenderness.  Abdominal: Soft. Bowel sounds are normal. There is no tenderness. There is no rebound and no guarding.  High-pitched bowel sounds throughout.  Left upper and left lower quadrant tenderness to palpation.  No rebound or guarding.  Musculoskeletal: Normal range of motion. She exhibits no edema or tenderness.  No midline thoracic or lumbar tenderness.  No CVA tenderness.  Neurological: She is alert and oriented to person, place, and time.  Moving all extremities without focal deficit.  Sensation fully intact.  Skin: Skin is warm and dry. Capillary refill takes less than 2 seconds. No rash noted. She is not diaphoretic. No erythema.  Psychiatric: She has a normal mood and affect. Her behavior is normal.  Nursing note and vitals reviewed.    ED Treatments / Results  Labs (all labs ordered are listed, but only abnormal results are displayed) Labs Reviewed  COMPREHENSIVE METABOLIC PANEL - Abnormal; Notable for the following components:      Result Value   Glucose, Bld 108 (*)    All other components within normal limits  CBC - Abnormal; Notable for the following components:   WBC 13.9 (*)    Hemoglobin 15.3 (*)    HCT 46.2 (*)    All other components within normal limits  URINALYSIS, ROUTINE W REFLEX MICROSCOPIC - Abnormal; Notable for the following components:   APPearance CLOUDY (*)    Leukocytes, UA LARGE (*)    Bacteria, UA RARE (*)    Non Squamous Epithelial 0-5 (*)    All other components within normal limits  URINE CULTURE  LIPASE, BLOOD    EKG None  Radiology Ct Abdomen Pelvis W Contrast  Result Date: 11/05/2017 CLINICAL DATA:  Abdominal pain, bloating EXAM: CT ABDOMEN AND PELVIS WITH CONTRAST  TECHNIQUE: Multidetector CT imaging of the abdomen and pelvis was performed using the standard protocol following bolus administration of intravenous contrast. CONTRAST:  151mL ISOVUE-300 IOPAMIDOL (ISOVUE-300) INJECTION 61% COMPARISON:  MRI 06/11/2017.  CT 05/01/2016 FINDINGS: Lower chest: Heart is borderline enlarged. Linear bibasilar atelectasis or scarring. No effusions. Hepatobiliary: Fatty infiltration of the liver. No focal abnormality or biliary ductal dilatation. Gallbladder unremarkable. Pancreas: No focal abnormality or ductal dilatation. Spleen: No focal abnormality.  Normal size. Adrenals/Urinary Tract: Low-density lesion in the mid to upper pole of the right kidney as followed on prior MRIs. No significant change in the overall size, measuring approximately 1.2 cm. No hydronephrosis. Small nodules in the adrenal glands bilaterally, stable. Urinary bladder unremarkable. Stomach/Bowel: There is mild wall thickening in small bowel loops within the right abdomen with surrounding haziness/fluid in the mesentery. Findings concerning for infectious or  inflammatory enteritis. No evidence of bowel obstruction. Stomach and large bowel grossly unremarkable. Vascular/Lymphatic: No evidence of aneurysm or adenopathy. Reproductive: Prior hysterectomy.  No adnexal masses. Other: No free fluid or free air. Musculoskeletal: No acute bony abnormality. IMPRESSION: Mild wall thickening within several mid abdominal small bowel loops in the right abdomen with surrounding mesenteric haziness/edema. Findings likely reflect infectious or inflammatory enteritis. No obstruction. Fatty liver. Stable low-density lesion in the right kidney, stable since prior MRI. Stable adrenal lesions. Electronically Signed   By: Rolm Baptise M.D.   On: 11/05/2017 20:44    Procedures Procedures (including critical care time)  Medications Ordered in ED Medications  iopamidol (ISOVUE-300) 61 % injection 100 mL (100 mLs Intravenous Contrast  Given 11/05/17 2016)  ciprofloxacin (CIPRO) tablet 500 mg (500 mg Oral Given 11/05/17 2127)  metroNIDAZOLE (FLAGYL) tablet 500 mg (500 mg Oral Given 11/05/17 2127)  ondansetron (ZOFRAN-ODT) disintegrating tablet 4 mg (4 mg Oral Given 11/05/17 2127)     Initial Impression / Assessment and Plan / ED Course  I have reviewed the triage vital signs and the nursing notes.  Pertinent labs & imaging results that were available during my care of the patient were reviewed by me and considered in my medical decision making (see chart for details).    CT with evidence of enteritis.  Likely UTI on UA.  Will treat with antibiotics.  Urine sent for culture.  Return precautions given.   Final Clinical Impressions(s) / ED Diagnoses   Final diagnoses:  Bacterial enteritis  Acute lower UTI    ED Discharge Orders         Ordered    ciprofloxacin (CIPRO) 500 MG tablet  2 times daily     11/05/17 2126    metroNIDAZOLE (FLAGYL) 500 MG tablet  2 times daily     11/05/17 2126    dicyclomine (BENTYL) 20 MG tablet  2 times daily PRN     11/05/17 2126    ondansetron (ZOFRAN) 4 MG tablet  Every 6 hours PRN     11/05/17 2126           Julianne Rice, MD 11/05/17 2128

## 2017-11-07 LAB — URINE CULTURE

## 2018-05-18 ENCOUNTER — Other Ambulatory Visit: Payer: Self-pay | Admitting: Urology

## 2018-05-18 DIAGNOSIS — N281 Cyst of kidney, acquired: Secondary | ICD-10-CM

## 2018-06-24 ENCOUNTER — Other Ambulatory Visit: Payer: Commercial Managed Care - PPO

## 2018-08-27 ENCOUNTER — Other Ambulatory Visit: Payer: Self-pay | Admitting: Urology

## 2018-08-29 ENCOUNTER — Ambulatory Visit
Admission: RE | Admit: 2018-08-29 | Discharge: 2018-08-29 | Disposition: A | Discharge status: Home / Self Care | Payer: Commercial Managed Care - PPO | Source: Ambulatory Visit | Attending: Urology | Admitting: Urology

## 2018-08-29 ENCOUNTER — Other Ambulatory Visit: Payer: Self-pay

## 2018-08-29 DIAGNOSIS — N281 Cyst of kidney, acquired: Secondary | ICD-10-CM

## 2018-08-29 MED ORDER — GADOBENATE DIMEGLUMINE 529 MG/ML IV SOLN
20.0000 mL | Freq: Once | INTRAVENOUS | Status: AC | PRN
  Administered 2018-08-29: 20 mL via INTRAVENOUS

## 2019-07-02 ENCOUNTER — Other Ambulatory Visit: Payer: Self-pay | Admitting: Urology

## 2019-07-02 DIAGNOSIS — D3000 Benign neoplasm of unspecified kidney: Secondary | ICD-10-CM

## 2019-08-31 IMAGING — MR MR ABDOMEN WO/W CM
9 of 18 series · 20 of 48 positions shown · IV contrast (multihance)
Comparison: Outside 05/09/2016 MRI abdomen. 05/01/2016 CT
abdomen/pelvis.

CLINICAL DATA: Follow-up indeterminate right renal and right liver
lesions on outside MRI.

EXAM:
MRI ABDOMEN WITHOUT AND WITH CONTRAST
TECHNIQUE: Multiplanar multisequence MR imaging of the abdomen was performed
both before and after the administration of intravenous contrast.
CONTRAST:  20mL MULTIHANCE GADOBENATE DIMEGLUMINE 529 MG/ML IV SOLN

[Series 3: DWI b500 · axial · 6.0mm · 1.72mm/px · z∈[-139,+149]mm · 2 of 76 slices shown]
[im 1/76]
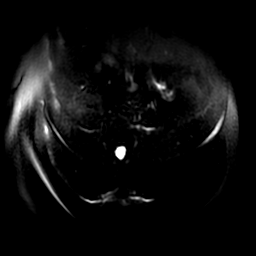
[im 76/76]
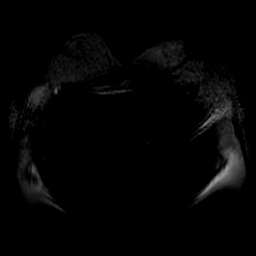

[Series 4: T2 fat-sat · axial · 5.0mm · 0.78mm/px · z∈[-140,+140]mm · 2 of 57 slices shown]
[im 1/57]
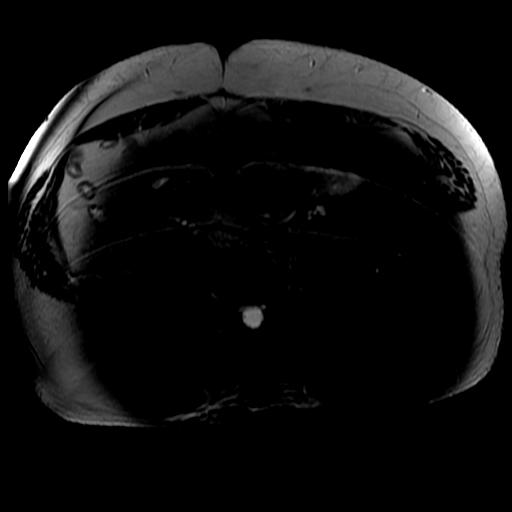
[im 57/57]
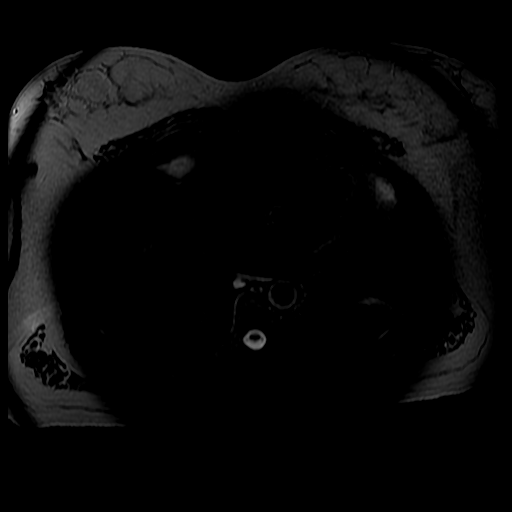

[Series 5: ax dualecho · axial · 5.0mm · 0.78mm/px · z∈[-140,+140]mm · 4 of 114 slices shown]
[im 1/114]
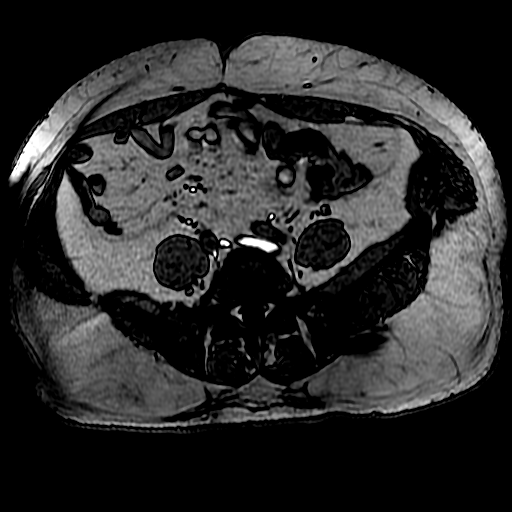
[im 38/114]
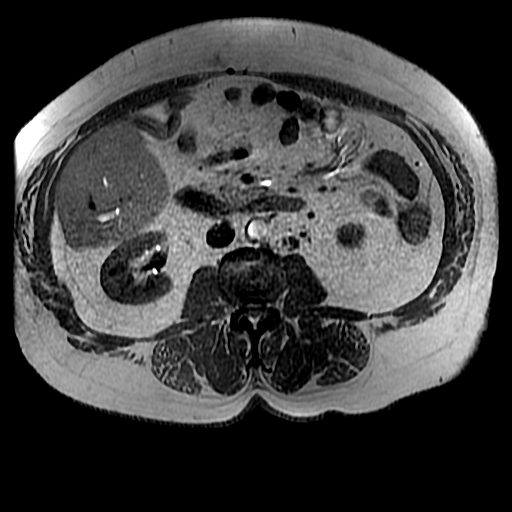
[im 76/114]
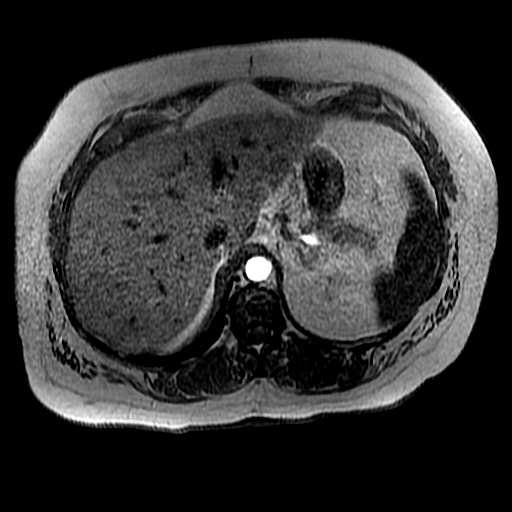
[im 114/114]
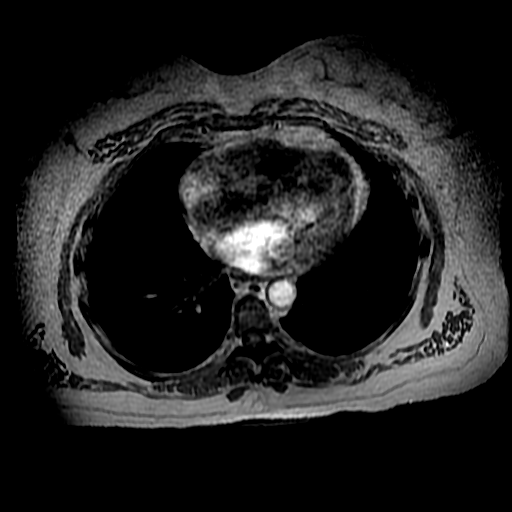

[Series 6: T2 · axial · 5.0mm · 0.78mm/px · z∈[-135,+140]mm · 2 of 56 slices shown (1 of 2)]
[im 1/56]
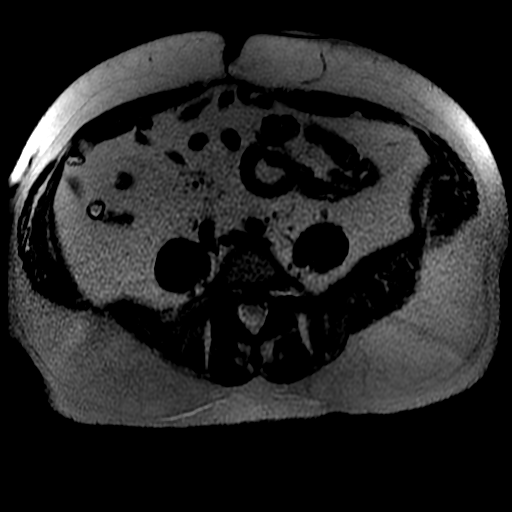
[im 56/56]
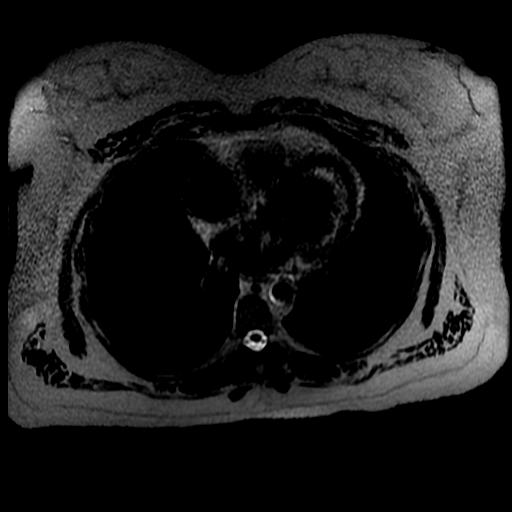

[Series 7: T2 · coronal · 5.0mm · 0.78mm/px · 2 of 52 slices shown (2 of 2)]
[im 1/52]
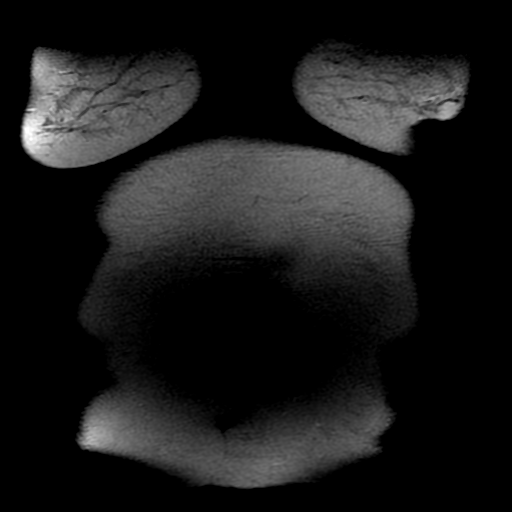
[im 52/52]
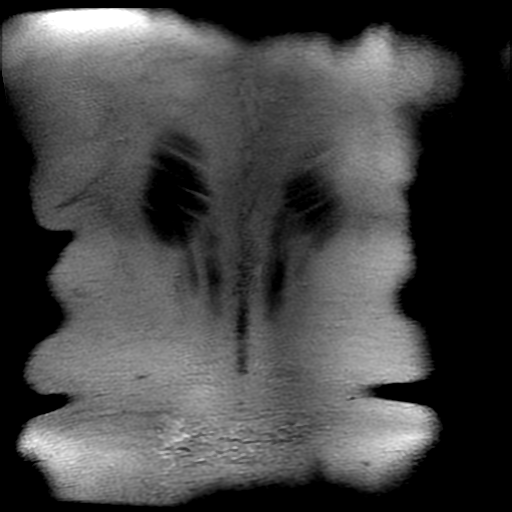

[Series 8: bSSFP · axial · 5.0mm · 0.78mm/px · z∈[-140,+140]mm · 2 of 57 slices shown]
[im 1/57]
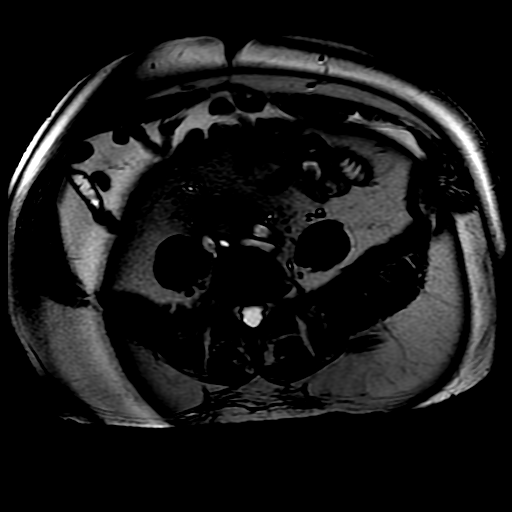
[im 57/57]
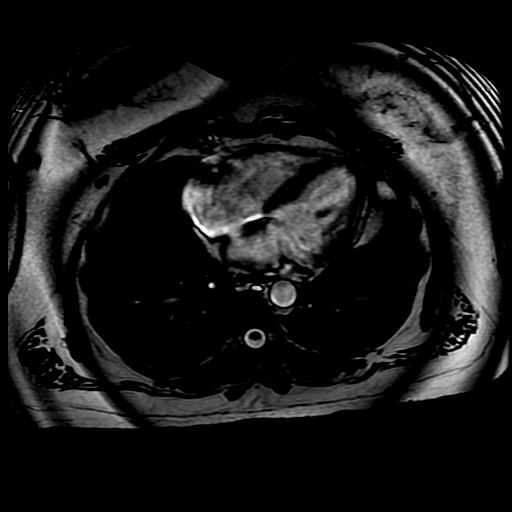

[Series 300: DWI · axial · 6.0mm · 1.72mm/px · 1 of 38 slices shown]
[im 1/38]
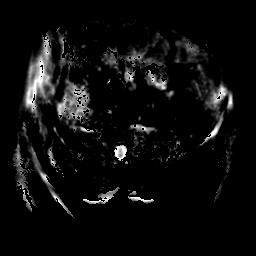

[Series 900: T1 dynamic · axial · 5.8mm · 0.78mm/px · z∈[-153,+100]mm · 3 of 88 slices shown (1 of 2)]
[im 1/88]
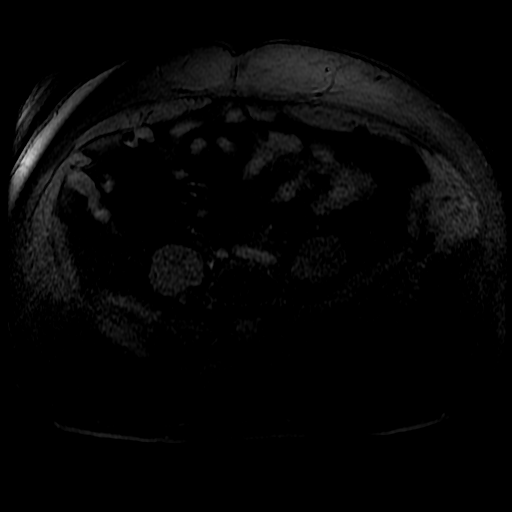
[im 44/88]
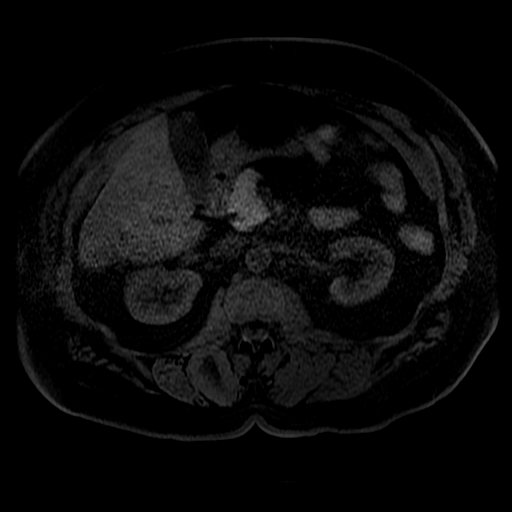
[im 88/88]
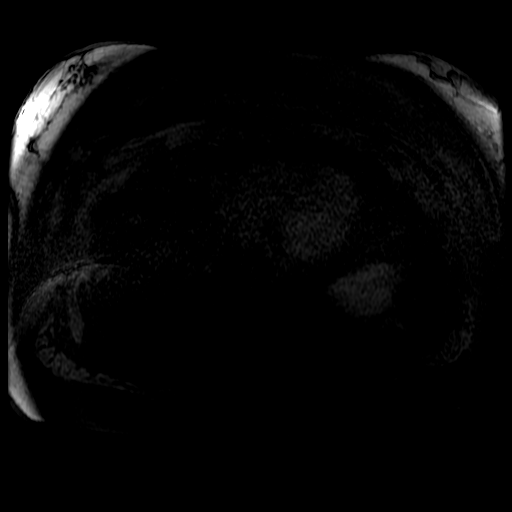

[Series 901: T1 dynamic · axial · 5.8mm · 0.78mm/px · z∈[-153,-28]mm · 2 of 88 slices shown (2 of 2)]
[im 1/88]
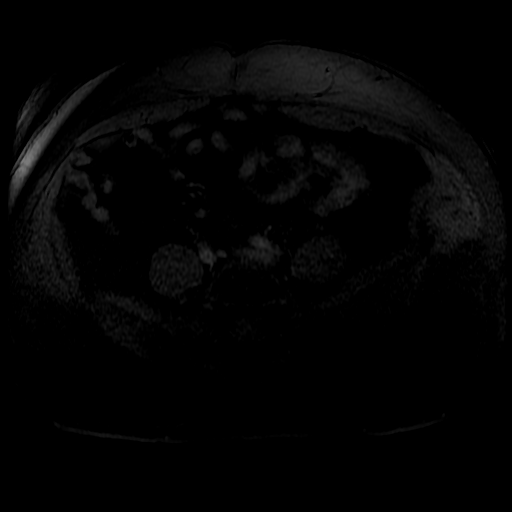
[im 44/88]
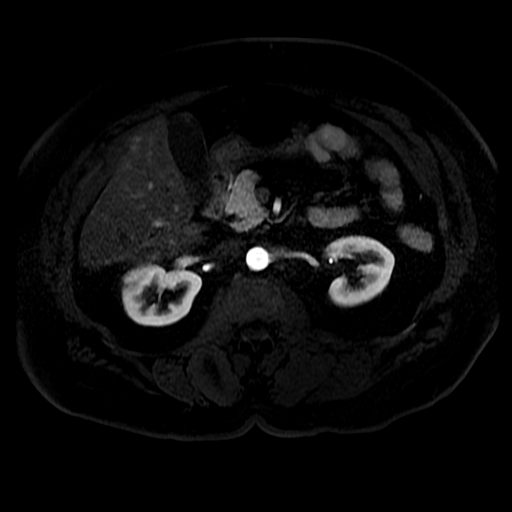

[20 of 48 positions shown; findings below may reference images not displayed]

FINDINGS: Lower chest: Stable mild scarring at the lung bases. No acute
abnormality at the lung bases.

Hepatobiliary: Normal liver size and configuration. Prominent
diffuse hepatic steatosis . There is a 2.4 x 1.9 cm segment 6 right
liver lobe mass (series 4/image 22), which is stable in size and
appearance since 05/09/2016 MRI and demonstrates minimal
heterogeneous T2 hyperintensity, T1 hypointensity and progressive
peripheral nodular enhancement, most compatible with a
sclerosed/atypical hemangioma. There is a 1.4 cm T2 hyperintense
segment 7 right liver lobe mass (series 4/ image 16) with
discontinuous progressive peripheral nodular enhancement, stable,
compatible with a benign hemangioma. No additional liver masses.
Heterogeneous liver parenchymal enhancement on the arterial phase
sequence is otherwise occult on the other sequences and compatible
with benign transient hepatic intensity differences. Normal
gallbladder with no cholelithiasis. No biliary ductal dilatation.
Common bile duct diameter 6 mm, top-normal. No choledocholithiasis .

Pancreas: No pancreatic mass or duct dilation.  No pancreas divisum.

Spleen: Normal size. No mass.

Adrenals/Urinary Tract: Stable 1.6 cm lipid poor right adrenal
adenoma. No left adrenal nodules. No hydronephrosis. There is a
x 1.1 cm renal cortical mass in the lateral mid to upper right
kidney (series 903/ image 51), previously 1.3 x 1.1 cm on 05/09/2016
MRI, which demonstrates T2 hypointensity and hypoenhancement. Simple
1.3 cm lower right renal cyst. Simple subcentimeter lateral
interpolar left renal cyst.

Stomach/Bowel: Grossly normal stomach. Visualized small and large
bowel is normal caliber, with no bowel wall thickening.

Vascular/Lymphatic: Normal caliber abdominal aorta. Patent portal,
splenic, hepatic and renal veins. No pathologically enlarged lymph
nodes in the abdomen.

Other: No abdominal ascites or focal fluid collection.

Musculoskeletal: No aggressive appearing focal osseous lesions.
IMPRESSION: 1. Hypoenhancing T2 hypointense 1.3 cm renal cortical mass in the
lateral mid to upper right kidney, compatible with papillary renal
cell carcinoma, not appreciably changed in size since 05/09/2016
MRI. Patent renal veins without tumor thrombus.
2. No adenopathy or other definite findings of metastatic disease in
the abdomen.
3. Segment 6 right liver lobe mass is stable since 05/09/2016 MRI
and demonstrates MRI features most suggestive of a
sclerosed/atypical hemangioma. Continued surveillance with MRI
abdomen without and with IV contrast is warranted in 6 months.
4. Stable lipid poor right adrenal adenoma.
5. Prominent diffuse hepatic steatosis.

## 2019-09-06 ENCOUNTER — Other Ambulatory Visit: Payer: Self-pay

## 2019-09-06 ENCOUNTER — Ambulatory Visit
Admission: RE | Admit: 2019-09-06 | Discharge: 2019-09-06 | Disposition: A | Discharge status: Home / Self Care | Payer: Commercial Managed Care - PPO | Source: Ambulatory Visit | Attending: Urology | Admitting: Urology

## 2019-09-06 DIAGNOSIS — D3000 Benign neoplasm of unspecified kidney: Secondary | ICD-10-CM

## 2019-09-06 MED ORDER — GADOBENATE DIMEGLUMINE 529 MG/ML IV SOLN
20.0000 mL | Freq: Once | INTRAVENOUS | Status: AC | PRN
  Administered 2019-09-06: 20 mL via INTRAVENOUS

## 2020-08-30 ENCOUNTER — Other Ambulatory Visit: Payer: Self-pay | Admitting: Urology

## 2020-08-30 DIAGNOSIS — D3000 Benign neoplasm of unspecified kidney: Secondary | ICD-10-CM

## 2020-09-16 ENCOUNTER — Other Ambulatory Visit: Payer: Commercial Managed Care - PPO

## 2020-09-19 ENCOUNTER — Ambulatory Visit
Admission: RE | Admit: 2020-09-19 | Discharge: 2020-09-19 | Disposition: A | Discharge status: Home / Self Care | Payer: Medicare Other | Source: Ambulatory Visit | Attending: Urology | Admitting: Urology

## 2020-09-19 ENCOUNTER — Other Ambulatory Visit: Payer: Self-pay

## 2020-09-19 DIAGNOSIS — N2889 Other specified disorders of kidney and ureter: Secondary | ICD-10-CM | POA: Diagnosis not present

## 2020-09-19 DIAGNOSIS — K760 Fatty (change of) liver, not elsewhere classified: Secondary | ICD-10-CM | POA: Diagnosis not present

## 2020-09-19 DIAGNOSIS — D3000 Benign neoplasm of unspecified kidney: Secondary | ICD-10-CM

## 2020-09-19 MED ORDER — GADOBENATE DIMEGLUMINE 529 MG/ML IV SOLN
20.0000 mL | Freq: Once | INTRAVENOUS | Status: AC | PRN
  Administered 2020-09-19: 20 mL via INTRAVENOUS

## 2020-10-16 DIAGNOSIS — L649 Androgenic alopecia, unspecified: Secondary | ICD-10-CM | POA: Diagnosis not present

## 2020-10-16 DIAGNOSIS — L570 Actinic keratosis: Secondary | ICD-10-CM | POA: Diagnosis not present

## 2020-10-19 DIAGNOSIS — E1169 Type 2 diabetes mellitus with other specified complication: Secondary | ICD-10-CM | POA: Diagnosis not present

## 2020-10-19 DIAGNOSIS — Z23 Encounter for immunization: Secondary | ICD-10-CM | POA: Diagnosis not present

## 2020-10-19 DIAGNOSIS — I10 Essential (primary) hypertension: Secondary | ICD-10-CM | POA: Diagnosis not present

## 2020-10-31 DIAGNOSIS — D3000 Benign neoplasm of unspecified kidney: Secondary | ICD-10-CM | POA: Diagnosis not present

## 2020-11-06 DIAGNOSIS — Z1231 Encounter for screening mammogram for malignant neoplasm of breast: Secondary | ICD-10-CM | POA: Diagnosis not present

## 2020-12-14 DIAGNOSIS — H0288A Meibomian gland dysfunction right eye, upper and lower eyelids: Secondary | ICD-10-CM | POA: Diagnosis not present

## 2020-12-14 DIAGNOSIS — H0288B Meibomian gland dysfunction left eye, upper and lower eyelids: Secondary | ICD-10-CM | POA: Diagnosis not present

## 2020-12-14 DIAGNOSIS — H40013 Open angle with borderline findings, low risk, bilateral: Secondary | ICD-10-CM | POA: Diagnosis not present

## 2020-12-14 DIAGNOSIS — H16223 Keratoconjunctivitis sicca, not specified as Sjogren's, bilateral: Secondary | ICD-10-CM | POA: Diagnosis not present

## 2020-12-14 DIAGNOSIS — Z961 Presence of intraocular lens: Secondary | ICD-10-CM | POA: Diagnosis not present

## 2020-12-14 DIAGNOSIS — H53021 Refractive amblyopia, right eye: Secondary | ICD-10-CM | POA: Diagnosis not present

## 2021-04-12 DIAGNOSIS — Z1329 Encounter for screening for other suspected endocrine disorder: Secondary | ICD-10-CM | POA: Diagnosis not present

## 2021-04-12 DIAGNOSIS — K7689 Other specified diseases of liver: Secondary | ICD-10-CM | POA: Diagnosis not present

## 2021-04-12 DIAGNOSIS — E7800 Pure hypercholesterolemia, unspecified: Secondary | ICD-10-CM | POA: Diagnosis not present

## 2021-04-12 DIAGNOSIS — E1169 Type 2 diabetes mellitus with other specified complication: Secondary | ICD-10-CM | POA: Diagnosis not present

## 2021-04-12 DIAGNOSIS — E559 Vitamin D deficiency, unspecified: Secondary | ICD-10-CM | POA: Diagnosis not present

## 2021-04-12 DIAGNOSIS — I10 Essential (primary) hypertension: Secondary | ICD-10-CM | POA: Diagnosis not present

## 2021-04-19 DIAGNOSIS — I10 Essential (primary) hypertension: Secondary | ICD-10-CM | POA: Diagnosis not present

## 2021-04-19 DIAGNOSIS — Z1389 Encounter for screening for other disorder: Secondary | ICD-10-CM | POA: Diagnosis not present

## 2021-04-19 DIAGNOSIS — E1169 Type 2 diabetes mellitus with other specified complication: Secondary | ICD-10-CM | POA: Diagnosis not present

## 2021-04-20 DIAGNOSIS — Z0001 Encounter for general adult medical examination with abnormal findings: Secondary | ICD-10-CM | POA: Diagnosis not present

## 2021-04-24 DIAGNOSIS — L649 Androgenic alopecia, unspecified: Secondary | ICD-10-CM | POA: Diagnosis not present

## 2021-04-24 DIAGNOSIS — L570 Actinic keratosis: Secondary | ICD-10-CM | POA: Diagnosis not present

## 2021-08-08 ENCOUNTER — Other Ambulatory Visit: Payer: Self-pay | Admitting: Urology

## 2021-08-08 DIAGNOSIS — D49511 Neoplasm of unspecified behavior of right kidney: Secondary | ICD-10-CM

## 2021-10-15 DIAGNOSIS — E1169 Type 2 diabetes mellitus with other specified complication: Secondary | ICD-10-CM | POA: Diagnosis not present

## 2021-10-16 ENCOUNTER — Ambulatory Visit
Admission: RE | Admit: 2021-10-16 | Discharge: 2021-10-16 | Disposition: A | Discharge status: Home / Self Care | Payer: Medicare Other | Source: Ambulatory Visit | Attending: Urology | Admitting: Urology

## 2021-10-16 DIAGNOSIS — K8020 Calculus of gallbladder without cholecystitis without obstruction: Secondary | ICD-10-CM | POA: Diagnosis not present

## 2021-10-16 DIAGNOSIS — N281 Cyst of kidney, acquired: Secondary | ICD-10-CM | POA: Diagnosis not present

## 2021-10-16 DIAGNOSIS — D49511 Neoplasm of unspecified behavior of right kidney: Secondary | ICD-10-CM

## 2021-10-16 DIAGNOSIS — N2889 Other specified disorders of kidney and ureter: Secondary | ICD-10-CM | POA: Diagnosis not present

## 2021-10-16 DIAGNOSIS — K769 Liver disease, unspecified: Secondary | ICD-10-CM | POA: Diagnosis not present

## 2021-10-16 MED ORDER — GADOBENATE DIMEGLUMINE 529 MG/ML IV SOLN
20.0000 mL | Freq: Once | INTRAVENOUS | Status: AC | PRN
  Administered 2021-10-16: 20 mL via INTRAVENOUS

## 2021-10-23 DIAGNOSIS — D3000 Benign neoplasm of unspecified kidney: Secondary | ICD-10-CM | POA: Diagnosis not present

## 2021-11-06 DIAGNOSIS — Z85828 Personal history of other malignant neoplasm of skin: Secondary | ICD-10-CM | POA: Diagnosis not present

## 2021-11-06 DIAGNOSIS — L570 Actinic keratosis: Secondary | ICD-10-CM | POA: Diagnosis not present

## 2021-12-20 DIAGNOSIS — Z961 Presence of intraocular lens: Secondary | ICD-10-CM | POA: Diagnosis not present

## 2021-12-20 DIAGNOSIS — H0288B Meibomian gland dysfunction left eye, upper and lower eyelids: Secondary | ICD-10-CM | POA: Diagnosis not present

## 2021-12-20 DIAGNOSIS — H0288A Meibomian gland dysfunction right eye, upper and lower eyelids: Secondary | ICD-10-CM | POA: Diagnosis not present

## 2021-12-20 DIAGNOSIS — H53021 Refractive amblyopia, right eye: Secondary | ICD-10-CM | POA: Diagnosis not present

## 2021-12-20 DIAGNOSIS — H40013 Open angle with borderline findings, low risk, bilateral: Secondary | ICD-10-CM | POA: Diagnosis not present

## 2022-05-01 DIAGNOSIS — E7800 Pure hypercholesterolemia, unspecified: Secondary | ICD-10-CM | POA: Diagnosis not present

## 2022-05-01 DIAGNOSIS — E039 Hypothyroidism, unspecified: Secondary | ICD-10-CM | POA: Diagnosis not present

## 2022-05-01 DIAGNOSIS — E559 Vitamin D deficiency, unspecified: Secondary | ICD-10-CM | POA: Diagnosis not present

## 2022-05-01 DIAGNOSIS — Z1329 Encounter for screening for other suspected endocrine disorder: Secondary | ICD-10-CM | POA: Diagnosis not present

## 2022-05-01 DIAGNOSIS — I10 Essential (primary) hypertension: Secondary | ICD-10-CM | POA: Diagnosis not present

## 2022-05-01 DIAGNOSIS — E1169 Type 2 diabetes mellitus with other specified complication: Secondary | ICD-10-CM | POA: Diagnosis not present

## 2022-05-07 DIAGNOSIS — E1169 Type 2 diabetes mellitus with other specified complication: Secondary | ICD-10-CM | POA: Diagnosis not present

## 2022-05-07 DIAGNOSIS — Z0001 Encounter for general adult medical examination with abnormal findings: Secondary | ICD-10-CM | POA: Diagnosis not present

## 2022-05-07 DIAGNOSIS — I10 Essential (primary) hypertension: Secondary | ICD-10-CM | POA: Diagnosis not present

## 2022-05-07 DIAGNOSIS — E876 Hypokalemia: Secondary | ICD-10-CM | POA: Diagnosis not present

## 2022-05-07 DIAGNOSIS — Z1389 Encounter for screening for other disorder: Secondary | ICD-10-CM | POA: Diagnosis not present

## 2022-05-08 DIAGNOSIS — Z85828 Personal history of other malignant neoplasm of skin: Secondary | ICD-10-CM | POA: Diagnosis not present

## 2022-05-08 DIAGNOSIS — L570 Actinic keratosis: Secondary | ICD-10-CM | POA: Diagnosis not present

## 2022-05-21 DIAGNOSIS — Z1231 Encounter for screening mammogram for malignant neoplasm of breast: Secondary | ICD-10-CM | POA: Diagnosis not present

## 2022-05-23 DIAGNOSIS — E876 Hypokalemia: Secondary | ICD-10-CM | POA: Diagnosis not present

## 2022-05-23 DIAGNOSIS — E1169 Type 2 diabetes mellitus with other specified complication: Secondary | ICD-10-CM | POA: Diagnosis not present

## 2022-06-19 DIAGNOSIS — E1169 Type 2 diabetes mellitus with other specified complication: Secondary | ICD-10-CM | POA: Diagnosis not present

## 2022-06-19 DIAGNOSIS — E876 Hypokalemia: Secondary | ICD-10-CM | POA: Diagnosis not present

## 2022-06-19 DIAGNOSIS — I10 Essential (primary) hypertension: Secondary | ICD-10-CM | POA: Diagnosis not present

## 2022-07-16 DIAGNOSIS — E1169 Type 2 diabetes mellitus with other specified complication: Secondary | ICD-10-CM | POA: Diagnosis not present

## 2022-08-20 ENCOUNTER — Other Ambulatory Visit: Payer: Self-pay | Admitting: Urology

## 2022-08-20 DIAGNOSIS — D3000 Benign neoplasm of unspecified kidney: Secondary | ICD-10-CM

## 2022-10-15 ENCOUNTER — Ambulatory Visit: Admission: RE | Admit: 2022-10-15 | Payer: Medicare Other | Source: Ambulatory Visit

## 2022-10-15 DIAGNOSIS — D3000 Benign neoplasm of unspecified kidney: Secondary | ICD-10-CM

## 2022-10-15 DIAGNOSIS — N2889 Other specified disorders of kidney and ureter: Secondary | ICD-10-CM | POA: Diagnosis not present

## 2022-10-15 MED ORDER — GADOPICLENOL 0.5 MMOL/ML IV SOLN
10.0000 mL | Freq: Once | INTRAVENOUS | Status: AC | PRN
  Administered 2022-10-15: 10 mL via INTRAVENOUS

## 2022-11-04 DIAGNOSIS — D3001 Benign neoplasm of right kidney: Secondary | ICD-10-CM | POA: Diagnosis not present

## 2022-11-06 DIAGNOSIS — L570 Actinic keratosis: Secondary | ICD-10-CM | POA: Diagnosis not present

## 2022-11-06 DIAGNOSIS — L649 Androgenic alopecia, unspecified: Secondary | ICD-10-CM | POA: Diagnosis not present

## 2022-12-19 DIAGNOSIS — Z23 Encounter for immunization: Secondary | ICD-10-CM | POA: Diagnosis not present

## 2022-12-19 DIAGNOSIS — E1169 Type 2 diabetes mellitus with other specified complication: Secondary | ICD-10-CM | POA: Diagnosis not present

## 2022-12-19 DIAGNOSIS — E876 Hypokalemia: Secondary | ICD-10-CM | POA: Diagnosis not present

## 2022-12-19 DIAGNOSIS — I10 Essential (primary) hypertension: Secondary | ICD-10-CM | POA: Diagnosis not present

## 2022-12-30 DIAGNOSIS — H16223 Keratoconjunctivitis sicca, not specified as Sjogren's, bilateral: Secondary | ICD-10-CM | POA: Diagnosis not present

## 2022-12-30 DIAGNOSIS — H40013 Open angle with borderline findings, low risk, bilateral: Secondary | ICD-10-CM | POA: Diagnosis not present

## 2022-12-30 DIAGNOSIS — Z961 Presence of intraocular lens: Secondary | ICD-10-CM | POA: Diagnosis not present

## 2023-01-24 DIAGNOSIS — R062 Wheezing: Secondary | ICD-10-CM | POA: Diagnosis not present

## 2023-01-24 DIAGNOSIS — J209 Acute bronchitis, unspecified: Secondary | ICD-10-CM | POA: Diagnosis not present

## 2023-02-01 DIAGNOSIS — J209 Acute bronchitis, unspecified: Secondary | ICD-10-CM | POA: Diagnosis not present

## 2023-02-01 DIAGNOSIS — R062 Wheezing: Secondary | ICD-10-CM | POA: Diagnosis not present

## 2023-02-08 ENCOUNTER — Emergency Department (HOSPITAL_COMMUNITY): Payer: Medicare Other

## 2023-02-08 ENCOUNTER — Encounter (HOSPITAL_COMMUNITY): Payer: Self-pay

## 2023-02-08 ENCOUNTER — Other Ambulatory Visit: Payer: Self-pay

## 2023-02-08 ENCOUNTER — Inpatient Hospital Stay (HOSPITAL_COMMUNITY)
Admission: EM | Admit: 2023-02-08 | Discharge: 2023-02-12 | DRG: 193 | Disposition: A | Discharge status: Home / Self Care | Payer: Medicare Other | Attending: Internal Medicine | Admitting: Internal Medicine

## 2023-02-08 DIAGNOSIS — Z789 Other specified health status: Secondary | ICD-10-CM

## 2023-02-08 DIAGNOSIS — Z9049 Acquired absence of other specified parts of digestive tract: Secondary | ICD-10-CM | POA: Diagnosis not present

## 2023-02-08 DIAGNOSIS — Z79899 Other long term (current) drug therapy: Secondary | ICD-10-CM | POA: Diagnosis not present

## 2023-02-08 DIAGNOSIS — Z87448 Personal history of other diseases of urinary system: Secondary | ICD-10-CM

## 2023-02-08 DIAGNOSIS — J9601 Acute respiratory failure with hypoxia: Secondary | ICD-10-CM | POA: Diagnosis not present

## 2023-02-08 DIAGNOSIS — I10 Essential (primary) hypertension: Secondary | ICD-10-CM

## 2023-02-08 DIAGNOSIS — J9801 Acute bronchospasm: Secondary | ICD-10-CM

## 2023-02-08 DIAGNOSIS — Z8709 Personal history of other diseases of the respiratory system: Secondary | ICD-10-CM

## 2023-02-08 DIAGNOSIS — Z9889 Other specified postprocedural states: Secondary | ICD-10-CM

## 2023-02-08 DIAGNOSIS — R0902 Hypoxemia: Secondary | ICD-10-CM | POA: Diagnosis not present

## 2023-02-08 DIAGNOSIS — R0602 Shortness of breath: Secondary | ICD-10-CM | POA: Diagnosis not present

## 2023-02-08 DIAGNOSIS — R739 Hyperglycemia, unspecified: Secondary | ICD-10-CM | POA: Diagnosis not present

## 2023-02-08 DIAGNOSIS — Z7982 Long term (current) use of aspirin: Secondary | ICD-10-CM | POA: Diagnosis not present

## 2023-02-08 DIAGNOSIS — Z8719 Personal history of other diseases of the digestive system: Secondary | ICD-10-CM | POA: Diagnosis not present

## 2023-02-08 DIAGNOSIS — Z7985 Long-term (current) use of injectable non-insulin antidiabetic drugs: Secondary | ICD-10-CM

## 2023-02-08 DIAGNOSIS — E876 Hypokalemia: Secondary | ICD-10-CM | POA: Diagnosis not present

## 2023-02-08 DIAGNOSIS — J189 Pneumonia, unspecified organism: Secondary | ICD-10-CM | POA: Diagnosis not present

## 2023-02-08 DIAGNOSIS — E669 Obesity, unspecified: Secondary | ICD-10-CM | POA: Diagnosis not present

## 2023-02-08 DIAGNOSIS — Z803 Family history of malignant neoplasm of breast: Secondary | ICD-10-CM | POA: Diagnosis not present

## 2023-02-08 DIAGNOSIS — R918 Other nonspecific abnormal finding of lung field: Secondary | ICD-10-CM | POA: Diagnosis not present

## 2023-02-08 DIAGNOSIS — Z6836 Body mass index (BMI) 36.0-36.9, adult: Secondary | ICD-10-CM

## 2023-02-08 DIAGNOSIS — E785 Hyperlipidemia, unspecified: Secondary | ICD-10-CM | POA: Diagnosis not present

## 2023-02-08 LAB — CBC
HCT: 41.2 % (ref 36.0–46.0)
Hemoglobin: 13.7 g/dL (ref 12.0–15.0)
MCH: 30 pg (ref 26.0–34.0)
MCHC: 33.3 g/dL (ref 30.0–36.0)
MCV: 90.2 fL (ref 80.0–100.0)
Platelets: 197 10*3/uL (ref 150–400)
RBC: 4.57 MIL/uL (ref 3.87–5.11)
RDW: 14.2 % (ref 11.5–15.5)
WBC: 16.2 10*3/uL — ABNORMAL HIGH (ref 4.0–10.5)
nRBC: 0 % (ref 0.0–0.2)

## 2023-02-08 LAB — I-STAT VENOUS BLOOD GAS, ED
Acid-Base Excess: 6 mmol/L — ABNORMAL HIGH (ref 0.0–2.0)
Bicarbonate: 32 mmol/L — ABNORMAL HIGH (ref 20.0–28.0)
Calcium, Ion: 1.17 mmol/L (ref 1.15–1.40)
HCT: 42 % (ref 36.0–46.0)
Hemoglobin: 14.3 g/dL (ref 12.0–15.0)
O2 Saturation: 73 %
Potassium: 3.1 mmol/L — ABNORMAL LOW (ref 3.5–5.1)
Sodium: 141 mmol/L (ref 135–145)
TCO2: 33 mmol/L — ABNORMAL HIGH (ref 22–32)
pCO2, Ven: 48.6 mm[Hg] (ref 44–60)
pH, Ven: 7.427 (ref 7.25–7.43)
pO2, Ven: 38 mm[Hg] (ref 32–45)

## 2023-02-08 LAB — BASIC METABOLIC PANEL
Anion gap: 9 (ref 5–15)
BUN: 8 mg/dL (ref 8–23)
CO2: 30 mmol/L (ref 22–32)
Calcium: 9.4 mg/dL (ref 8.9–10.3)
Chloride: 102 mmol/L (ref 98–111)
Creatinine, Ser: 0.98 mg/dL (ref 0.44–1.00)
GFR, Estimated: >60 mL/min (ref >60)
Glucose, Bld: 135 mg/dL — ABNORMAL HIGH (ref 70–99)
Potassium: 3.1 mmol/L — ABNORMAL LOW (ref 3.5–5.1)
Sodium: 141 mmol/L (ref 135–145)

## 2023-02-08 LAB — RESP PANEL BY RT-PCR (RSV, FLU A&B, COVID)  RVPGX2
Influenza A by PCR: NEGATIVE
Influenza B by PCR: NEGATIVE
Resp Syncytial Virus by PCR: NEGATIVE
SARS Coronavirus 2 by RT PCR: NEGATIVE

## 2023-02-08 LAB — STREP PNEUMONIAE URINARY ANTIGEN: Strep Pneumo Urinary Antigen: NEGATIVE

## 2023-02-08 LAB — HIV ANTIBODY (ROUTINE TESTING W REFLEX): HIV Screen 4th Generation wRfx: NONREACTIVE

## 2023-02-08 MED ORDER — IPRATROPIUM-ALBUTEROL 0.5-2.5 (3) MG/3ML IN SOLN
3.0000 mL | RESPIRATORY_TRACT | Status: DC | PRN
  Administered 2023-02-09 – 2023-02-11 (×9): 3 mL via RESPIRATORY_TRACT
  Filled 2023-02-08 (×9): qty 3

## 2023-02-08 MED ORDER — LISINOPRIL 20 MG PO TABS: 20.0000 mg | ORAL_TABLET | Freq: Every day | ORAL | Status: DC

## 2023-02-08 MED ORDER — LEVOFLOXACIN IN D5W 750 MG/150ML IV SOLN
750.0000 mg | INTRAVENOUS | Status: DC
  Administered 2023-02-09 – 2023-02-10 (×2): 750 mg via INTRAVENOUS
  Filled 2023-02-08 (×2): qty 150

## 2023-02-08 MED ORDER — ACETAMINOPHEN 650 MG RE SUPP: 650.0000 mg | Freq: Four times a day (QID) | RECTAL | Status: DC | PRN

## 2023-02-08 MED ORDER — ALBUTEROL SULFATE HFA 108 (90 BASE) MCG/ACT IN AERS
2.0000 | INHALATION_SPRAY | RESPIRATORY_TRACT | Status: DC | PRN
  Administered 2023-02-08: 2 via RESPIRATORY_TRACT
  Filled 2023-02-08: qty 6.7

## 2023-02-08 MED ORDER — ALBUTEROL SULFATE (2.5 MG/3ML) 0.083% IN NEBU
5.0000 mg | INHALATION_SOLUTION | Freq: Once | RESPIRATORY_TRACT | Status: AC
  Administered 2023-02-08: 5 mg via RESPIRATORY_TRACT
  Filled 2023-02-08: qty 6

## 2023-02-08 MED ORDER — LISINOPRIL-HYDROCHLOROTHIAZIDE 20-12.5 MG PO TABS: 1.0000 | ORAL_TABLET | Freq: Every day | ORAL | Status: DC

## 2023-02-08 MED ORDER — ROSUVASTATIN CALCIUM 20 MG PO TABS
20.0000 mg | ORAL_TABLET | Freq: Every evening | ORAL | Status: DC
  Administered 2023-02-08 – 2023-02-11 (×4): 20 mg via ORAL
  Filled 2023-02-08 (×4): qty 1

## 2023-02-08 MED ORDER — HYDROCHLOROTHIAZIDE 12.5 MG PO TABS: 12.5000 mg | ORAL_TABLET | Freq: Every day | ORAL | Status: DC

## 2023-02-08 MED ORDER — LISINOPRIL-HYDROCHLOROTHIAZIDE 20-12.5 MG PO TABS: 2.0000 | ORAL_TABLET | Freq: Every day | ORAL | Status: DC

## 2023-02-08 MED ORDER — HYDROCHLOROTHIAZIDE 25 MG PO TABS
25.0000 mg | ORAL_TABLET | Freq: Every day | ORAL | Status: DC
  Administered 2023-02-09 – 2023-02-12 (×4): 25 mg via ORAL
  Filled 2023-02-08 (×4): qty 1

## 2023-02-08 MED ORDER — DM-GUAIFENESIN ER 30-600 MG PO TB12
1.0000 | ORAL_TABLET | Freq: Two times a day (BID) | ORAL | Status: DC
  Administered 2023-02-08 – 2023-02-09 (×2): 1 via ORAL
  Filled 2023-02-08 (×2): qty 1

## 2023-02-08 MED ORDER — POTASSIUM CHLORIDE CRYS ER 20 MEQ PO TBCR
40.0000 meq | EXTENDED_RELEASE_TABLET | Freq: Once | ORAL | Status: AC
  Administered 2023-02-08: 40 meq via ORAL
  Filled 2023-02-08: qty 2

## 2023-02-08 MED ORDER — LISINOPRIL 20 MG PO TABS
40.0000 mg | ORAL_TABLET | Freq: Every day | ORAL | Status: DC
  Administered 2023-02-09 – 2023-02-12 (×4): 40 mg via ORAL
  Filled 2023-02-08 (×4): qty 2

## 2023-02-08 MED ORDER — PREDNISONE 20 MG PO TABS
40.0000 mg | ORAL_TABLET | Freq: Once | ORAL | Status: AC
  Administered 2023-02-08: 40 mg via ORAL
  Filled 2023-02-08: qty 2

## 2023-02-08 MED ORDER — LEVOFLOXACIN IN D5W 750 MG/150ML IV SOLN
750.0000 mg | Freq: Once | INTRAVENOUS | Status: AC
  Administered 2023-02-08: 750 mg via INTRAVENOUS
  Filled 2023-02-08: qty 150

## 2023-02-08 MED ORDER — ALBUTEROL SULFATE (2.5 MG/3ML) 0.083% IN NEBU: 3.0000 mL | INHALATION_SOLUTION | RESPIRATORY_TRACT | Status: DC | PRN

## 2023-02-08 MED ORDER — ACETAMINOPHEN 325 MG PO TABS
650.0000 mg | ORAL_TABLET | Freq: Four times a day (QID) | ORAL | Status: DC | PRN
  Administered 2023-02-10: 650 mg via ORAL
  Filled 2023-02-08: qty 2

## 2023-02-08 MED ORDER — ENOXAPARIN SODIUM 40 MG/0.4ML IJ SOSY
40.0000 mg | PREFILLED_SYRINGE | INTRAMUSCULAR | Status: DC
  Administered 2023-02-08 – 2023-02-12 (×5): 40 mg via SUBCUTANEOUS
  Filled 2023-02-08 (×5): qty 0.4

## 2023-02-08 NOTE — ED Provider Notes (Signed)
[facility] EMERGENCY DEPARTMENT AT [facility] Provider Note   CSN: [id number] Arrival date & time: 02/08/23  1213     History  Chief Complaint  Patient presents with   Shortness of Breath    Melinda Stevens is a 67 y.o. female.  HPI  67 year old female history of hypertension, bronchitis, presents today complaining of cough and dyspnea.  She reports she began having a cough lower half to 2 weeks ago.  She was seen by her primary care physician and given a course of Zithromax and steroids.  She continued to have cough and dyspnea and was reseen and given Augmentin.  She has complete completed the steroids.  She was given albuterol inhaler.  She does use it several times a day without relief.  She continues to have increasing dyspnea.  She continues to have productive cough.  She has not noted fever.  She denies nausea vomiting or diarrhea.     Home Medications Prior to Admission medications   Medication Sig Start Date End Date Taking? Authorizing Provider  acetaminophen (TYLENOL) 500 MG tablet Take 500 mg by mouth every 6 (six) hours as needed for mild pain or moderate pain.    [provider]  aspirin EC 81 MG tablet Take 81 mg by mouth daily.    [provider]  ciprofloxacin (CIPRO) 500 MG tablet Take 1 tablet (500 mg total) by mouth 2 (two) times daily. One po bid x 7 days 11/05/17   Loren Racer, MD  dicyclomine (BENTYL) 20 MG tablet Take 1 tablet (20 mg total) by mouth 2 (two) times daily as needed for spasms. 11/05/17   Loren Racer, MD  lisinopril-hydrochlorothiazide (PRINZIDE,ZESTORETIC) 20-12.5 MG tablet Take 1 tablet by mouth daily.    [provider]  metroNIDAZOLE (FLAGYL) 500 MG tablet Take 1 tablet (500 mg total) by mouth 2 (two) times daily. One po bid x 7 days 11/05/17   Loren Racer, MD  Multiple Vitamin (MULTIVITAMIN WITH MINERALS) TABS tablet Take 1 tablet by mouth daily.    [provider]  Multiple  Vitamins-Minerals (ICAPS) TABS Take 1 tablet by mouth daily.    [provider]  ondansetron (ZOFRAN) 4 MG tablet Take 1 tablet (4 mg total) by mouth every 6 (six) hours as needed for nausea or vomiting. 11/05/17   Loren Racer, MD      Allergies    Patient has no known allergies.    Review of Systems   Review of Systems  Physical Exam Updated Vital Signs BP 120/70   Pulse (!) 113   Temp 98.2 F (36.8 C)   Resp 19   Ht 1.651 m (5\' 5" )   Wt 99.3 kg   SpO2 95%   BMI 36.44 kg/m  Physical Exam Vitals and nursing note reviewed.  Constitutional:      Appearance: She is well-developed.  HENT:     Mouth/Throat:     Mouth: Mucous membranes are moist.  Eyes:     Pupils: Pupils are equal, round, and reactive to light.  Cardiovascular:     Rate and Rhythm: Normal rate and regular rhythm.  Pulmonary:     Effort: Pulmonary effort is normal.     Breath sounds: Examination of the right-middle field reveals rhonchi. Examination of the left-middle field reveals rhonchi. Examination of the right-lower field reveals wheezing and rhonchi. Examination of the left-lower field reveals wheezing and rhonchi. Wheezing and rhonchi present.  Musculoskeletal:        General:  Normal range of motion.     Cervical back: Normal range of motion.  Skin:    General: Skin is warm.     Capillary Refill: Capillary refill takes less than 2 seconds.  Neurological:     General: No focal deficit present.     Mental Status: She is alert.  Psychiatric:        Mood and Affect: Mood normal.     ED Results / Procedures / Treatments   Labs (all labs ordered are listed, but only abnormal results are displayed) Labs Reviewed  CBC - Abnormal; Notable for the following components:      Result Value   WBC 16.2 (*)    All other components within normal limits  BASIC METABOLIC PANEL - Abnormal; Notable for the following components:   Potassium 3.1 (*)    Glucose, Bld 135 (*)    All other components  within normal limits  I-STAT VENOUS BLOOD GAS, ED - Abnormal; Notable for the following components:   Bicarbonate 32.0 (*)    TCO2 33 (*)    Acid-Base Excess 6.0 (*)    Potassium 3.1 (*)    All other components within normal limits  RESP PANEL BY RT-PCR (RSV, FLU A&B, COVID)  [id number]  BLOOD GAS, VENOUS    EKG None  Radiology DG Chest 2 View  Result Date: 02/08/2023 CLINICAL DATA:  Shortness of breath EXAM: CHEST - 2 VIEW COMPARISON:  05/02/2016 FINDINGS: The heart size and mediastinal contours are within normal limits. Mild streaky left basilar opacity. Right lung is clear. No pleural effusion or pneumothorax. The visualized skeletal structures are unremarkable. IMPRESSION: Mild streaky left basilar opacity, favor atelectasis. Electronically Signed   By: Duanne Guess D.O.   On: 02/08/2023 12:43    Procedures Procedures    Medications Ordered in ED Medications  albuterol (VENTOLIN HFA) 108 (90 Base) MCG/ACT inhaler 2 puff (2 puffs Inhalation Given 02/08/23 1305)  levofloxacin (LEVAQUIN) IVPB 750 mg (750 mg Intravenous New Bag/Given 02/08/23 1545)  predniSONE (DELTASONE) tablet 40 mg (40 mg Oral Given 02/08/23 1335)  albuterol (PROVENTIL) (2.5 MG/3ML) 0.083% nebulizer solution 5 mg (5 mg Nebulization Given 02/08/23 1412)    ED Course/ Medical Decision Making/ A&P Clinical Course as of 02/08/23 1545  Sat Feb 08, 2023  1513 Chest x-Kristina Bertone reviewed interpreted and significant for left lower lobe infiltrate [DR]    Clinical Course User Index [DR] Margarita Grizzle, MD                                 Medical Decision Making Amount and/or Complexity of Data Reviewed Labs: ordered. Radiology: ordered.  Risk Prescription drug management.   67 year old female presents today complaining of cough and dyspnea for the past 1 and half weeks.  She has been treated outpatient with Zithromax and Augmentin.  She continues to have increased coughing and dyspnea.  X-Yeilin Zweber here is consistent  with left lower lobe infiltrate.  Patient has leukocytosis with white blood cell count of 16,200.  She is somewhat dyspneic and sats are 90% on reexam.  She is being placed on nasal cannula Patient had some wheezing and rhonchi on exam.  She was given repeat dose of prednisone. IV levaquin initiated. Patient with improved sats with oxygen at 2 l/m Discussed with Dr. Austin Miles who will see for admission       Final Clinical Impression(s) / ED Diagnoses Final diagnoses:  Hypoxia  Community acquired pneumonia of left lower lobe of lung    Rx / DC Orders ED Discharge Orders     None         Margarita Grizzle, MD 02/08/23 [phone]

## 2023-02-08 NOTE — ED Notes (Signed)
ED TO INPATIENT HANDOFF REPORT  ED Nurse Name and Phone #: Topher RN [phone]  S Name/Age/Gender Verita Schneiders 67 y.o. female Room/Bed: 019C/019C  Code Status   Code Status: Not on file  Home/SNF/Other Home Patient oriented to: self, place, time, and situation Is this baseline? Yes   Triage Complete: Triage complete  Chief Complaint CAP (community acquired pneumonia) [J18.9]  Triage Note Pt arrived POV from home c/o [facility] x2 weeks. Pt states she got a Z-pack and it didn't work, she went back they put her on Augmentin and gave her an inhaler but today it is even worse.    Allergies No Known Allergies  Level of Care/Admitting Diagnosis ED Disposition     ED Disposition  Admit   Condition  --   Comment  Hospital Area: [facility] [[id number]]  Level of Care: Med-Surg [16]  May place patient in observation at [facility] or [facility] if equivalent level of care is available:: No  Covid Evaluation: Asymptomatic - no recent exposure (last 10 days) testing not required  Diagnosis: CAP (community acquired pneumonia) [295188]  Admitting Physician: Briscoe Burns [[id number]]  Attending Physician: Briscoe Burns [[id number]]          B Medical/Surgery History Past Medical History:  Diagnosis Date   Diverticulitis    Hypertension    Renal disorder    Past Surgical History:  Procedure Laterality Date   BREAST EXCISIONAL BIOPSY Left    COLON SURGERY     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     A IV Location/Drains/Wounds Patient Lines/Drains/Airways Status     Active Line/Drains/Airways     Name Placement date Placement time Site Days   Peripheral IV 02/08/23 20 G Anterior;Proximal;Right Forearm 02/08/23  1417  Forearm  less than 1            Intake/Output Last 24 hours No intake or output data in the 24 hours ending 02/08/23 1553  Labs/Imaging Results for orders placed or performed during the hospital encounter of  02/08/23 (from the past 48 hour(s))  CBC     Status: Abnormal   Collection Time: 02/08/23  1:20 PM  Result Value Ref Range   WBC 16.2 (H) 4.0 - 10.5 K/uL   RBC 4.57 3.87 - 5.11 MIL/uL   Hemoglobin 13.7 12.0 - 15.0 g/dL   HCT 32.3 55.7 - 32.2 %   MCV 90.2 80.0 - 100.0 fL   MCH 30.0 26.0 - 34.0 pg   MCHC 33.3 30.0 - 36.0 g/dL   RDW 02.5 42.7 - 06.2 %   Platelets 197 150 - 400 K/uL   nRBC 0.0 0.0 - 0.2 %    Comment: Performed at [facility], [address] [place], [place] [address]  Basic metabolic panel     Status: Abnormal   Collection Time: 02/08/23  1:20 PM  Result Value Ref Range   Sodium 141 135 - 145 mmol/L   Potassium 3.1 (L) 3.5 - 5.1 mmol/L   Chloride 102 98 - 111 mmol/L   CO2 30 22 - 32 mmol/L   Glucose, Bld 135 (H) 70 - 99 mg/dL    Comment: Glucose reference range applies only to samples taken after fasting for at least 8 hours.   BUN 8 8 - 23 mg/dL   Creatinine, Ser 3.15 0.44 - 1.00 mg/dL   Calcium 9.4 8.9 - 17.6 mg/dL   GFR, Estimated >16 >07 mL/min    Comment: (  NOTE) Calculated using the CKD-EPI Creatinine Equation (2021)    Anion gap 9 5 - 15    Comment: Performed at [facility], [address], [place], [place] [address]  I-Stat venous blood gas, ED     Status: Abnormal   Collection Time: 02/08/23  1:27 PM  Result Value Ref Range   pH, Ven 7.427 7.25 - 7.43   pCO2, Ven 48.6 44 - 60 mmHg   pO2, Ven 38 32 - 45 mmHg   Bicarbonate 32.0 (H) 20.0 - 28.0 mmol/L   TCO2 33 (H) 22 - 32 mmol/L   O2 Saturation 73 %   Acid-Base Excess 6.0 (H) 0.0 - 2.0 mmol/L   Sodium 141 135 - 145 mmol/L   Potassium 3.1 (L) 3.5 - 5.1 mmol/L   Calcium, Ion 1.17 1.15 - 1.40 mmol/L   HCT 42.0 36.0 - 46.0 %   Hemoglobin 14.3 12.0 - 15.0 g/dL   Sample type VENOUS    Comment NOTIFIED PHYSICIAN    DG Chest 2 View  Result Date: 02/08/2023 CLINICAL DATA:  Shortness of breath EXAM: CHEST - 2 VIEW COMPARISON:  05/02/2016 FINDINGS: The heart size and mediastinal contours  are within normal limits. Mild streaky left basilar opacity. Right lung is clear. No pleural effusion or pneumothorax. The visualized skeletal structures are unremarkable. IMPRESSION: Mild streaky left basilar opacity, favor atelectasis. Electronically Signed   By: Duanne Guess D.O.   On: 02/08/2023 12:43    Pending Labs Unresulted Labs (From admission, onward)     Start     Ordered   02/08/23 1303  Resp panel by RT-PCR (RSV, Flu A&B, Covid) Anterior Nasal Swab  Once,   URGENT        02/08/23 1302   02/08/23 1301  Blood gas, venous (at [facility] and [facility])  Once,   R        02/08/23 1301            Vitals/Pain Today's Vitals   02/08/23 1330 02/08/23 1430 02/08/23 1500 02/08/23 1515  BP: 137/60 95/69 120/70   Pulse: (!) 113 (!) 118 (!) 110 (!) 113  Resp:      Temp:      TempSrc:      SpO2: 90% 94% 94% 95%  Weight:      Height:      PainSc:        Isolation Precautions No active isolations  Medications Medications  albuterol (VENTOLIN HFA) 108 (90 Base) MCG/ACT inhaler 2 puff (2 puffs Inhalation Given 02/08/23 1305)  levofloxacin (LEVAQUIN) IVPB 750 mg (750 mg Intravenous New Bag/Given 02/08/23 1545)  predniSONE (DELTASONE) tablet 40 mg (40 mg Oral Given 02/08/23 1335)  albuterol (PROVENTIL) (2.5 MG/3ML) 0.083% nebulizer solution 5 mg (5 mg Nebulization Given 02/08/23 1412)    Mobility walks     Focused Assessments Pulmonary Assessment Handoff:  Lung sounds: L Breath Sounds: Inspiratory wheezes, Expiratory wheezes R Breath Sounds: Expiratory wheezes, Inspiratory wheezes O2 Device: Nasal Cannula O2 Flow Rate (L/min): 2 L/min    R Recommendations: See Admitting Provider Note  Report given to: 2W09

## 2023-02-08 NOTE — H&P (Addendum)
History and Physical    Patient: Melinda Stevens:[id number] DOB: 12/27/1955 DOA: 02/08/2023 DOS: the patient was seen and examined on 02/08/2023 PCP: Selinda Flavin, MD  Patient coming from: Home  Chief Complaint:  Chief Complaint  Patient presents with   Shortness of Breath   HPI: Melinda Stevens is a 67 y.o. female with medical history significant of HTN and bronchitis presenting with complaints of cough, congestion, and dyspnea.  She states that she began having a cough about 2-1/2 weeks ago.  She went to see her primary care doctor and was provided a course of azithromycin along with steroids.  However even after completing course of azithromycin and steroids, her symptoms did not resolve.  She again went to see her primary care doctor and was prescribed Augmentin along with an albuterol inhaler.  She completed her Augmentin therapy without any notable improvement in her symptoms.  She reports persistence of her cough, congestion, dyspnea.  States that her cough is productive of greenish colored sputum.  She also states that her dyspnea is present at rest along with exertion.  She denies any fevers, chills, nausea, vomiting, chest pain, palpitations, diarrhea, or any urinary symptoms.  ED course: CBC with leukocytosis (16.2).  BMP with mild hypokalemia and mild hyperglycemia, otherwise stable kidney function.  Chest x-ray does show likely left lower lobe infiltrate.  She was started on Levaquin per ED provider given failure of azithromycin and Augmentin in the outpatient setting.  [facility] hospitalist asked to evaluate patient for admission.   Review of Systems: As mentioned in the history of present illness. All other systems reviewed and are negative. Past Medical History:  Diagnosis Date   Diverticulitis    Hypertension    Renal disorder    Past Surgical History:  Procedure Laterality Date   BREAST EXCISIONAL BIOPSY Left    COLON SURGERY     hx of colectomy Large intestines due to  diverculitis   Social History:  reports that she has never smoked. She has never used smokeless tobacco. She reports that she does not currently use alcohol. She reports that she does not currently use drugs.  No Known Allergies  Family History  Problem Relation Age of Onset   Breast cancer Sister    Breast cancer Maternal Aunt     Prior to Admission medications   Medication Sig Start Date End Date Taking? Authorizing Provider  acetaminophen (TYLENOL) 500 MG tablet Take 500 mg by mouth every 6 (six) hours as needed for mild pain or moderate pain.    [provider]  aspirin EC 81 MG tablet Take 81 mg by mouth daily.    [provider]  ciprofloxacin (CIPRO) 500 MG tablet Take 1 tablet (500 mg total) by mouth 2 (two) times daily. One po bid x 7 days 11/05/17   Loren Racer, MD  dicyclomine (BENTYL) 20 MG tablet Take 1 tablet (20 mg total) by mouth 2 (two) times daily as needed for spasms. 11/05/17   Loren Racer, MD  lisinopril-hydrochlorothiazide (PRINZIDE,ZESTORETIC) 20-12.5 MG tablet Take 1 tablet by mouth daily.    [provider]  metroNIDAZOLE (FLAGYL) 500 MG tablet Take 1 tablet (500 mg total) by mouth 2 (two) times daily. One po bid x 7 days 11/05/17   Loren Racer, MD  Multiple Vitamin (MULTIVITAMIN WITH MINERALS) TABS tablet Take 1 tablet by mouth daily.    [provider]  Multiple Vitamins-Minerals (ICAPS) TABS Take 1 tablet by mouth daily.    [provider]  ondansetron (ZOFRAN) 4 MG tablet Take 1 tablet (4 mg total) by mouth every 6 (six) hours as needed for nausea or vomiting. 11/05/17   Loren Racer, MD    Physical Exam: Vitals:   02/08/23 1430 02/08/23 1500 02/08/23 1515 02/08/23 1634  BP: 95/69 120/70  (!) 149/78  Pulse: (!) 118 (!) 110 (!) 113 (!) 118  Resp:    16  Temp:    99.7 F (37.6 C)  TempSrc:    Oral  SpO2: 94% 94% 95% 94%  Weight:      Height:       Physical Exam Constitutional:       Appearance: She is well-developed. She is obese. She is not ill-appearing.  HENT:     Head: Normocephalic and atraumatic.     Mouth/Throat:     Mouth: Mucous membranes are moist.     Pharynx: Oropharynx is clear. No oropharyngeal exudate.  Eyes:     General: No scleral icterus.    Extraocular Movements: Extraocular movements intact.     Conjunctiva/sclera: Conjunctivae normal.     Pupils: Pupils are equal, round, and reactive to light.  Cardiovascular:     Rate and Rhythm: Regular rhythm. Tachycardia present.     Pulses: Normal pulses.     Heart sounds: Normal heart sounds. No murmur heard.    No friction rub. No gallop.  Pulmonary:     Effort: Pulmonary effort is normal. No accessory muscle usage or respiratory distress.     Breath sounds: No rhonchi or rales.     Comments: Coarse breath sounds along with mild wheezing at lung bases bilaterally. Abdominal:     General: Bowel sounds are normal. There is no distension.     Palpations: Abdomen is soft.     Tenderness: There is no abdominal tenderness. There is no guarding or rebound.  Musculoskeletal:        General: Normal range of motion.     Right lower leg: No edema.     Left lower leg: No edema.  Skin:    General: Skin is warm and dry.  Neurological:     General: No focal deficit present.     Mental Status: She is alert and oriented to person, place, and time.  Psychiatric:        Mood and Affect: Mood normal.        Behavior: Behavior normal.     Data Reviewed:  There are no new results to review at this time.    Latest Ref Rng & Units 02/08/2023    1:27 PM 02/08/2023    1:20 PM 11/05/2017    5:34 PM  CBC  WBC 4.0 - 10.5 K/uL  16.2  13.9   Hemoglobin 12.0 - 15.0 g/dL 16.1  09.6  04.5   Hematocrit 36.0 - 46.0 % 42.0  41.2  46.2   Platelets 150 - 400 K/uL  197  236    BMET    Component Value Date/Time   NA 141 02/08/2023 1327   K 3.1 (L) 02/08/2023 1327   CL 102 02/08/2023 1320   CO2 30 02/08/2023 1320    GLUCOSE 135 (H) 02/08/2023 1320   BUN 8 02/08/2023 1320   CREATININE 0.98 02/08/2023 1320   CALCIUM 9.4 02/08/2023 1320   GFRNONAA >60 02/08/2023 1320   DG Chest 2 View  Result Date: 02/08/2023 CLINICAL DATA:  Shortness of breath EXAM: CHEST - 2 VIEW COMPARISON:  05/02/2016 FINDINGS: The heart size  and mediastinal contours are within normal limits. Mild streaky left basilar opacity. Right lung is clear. No pleural effusion or pneumothorax. The visualized skeletal structures are unremarkable. IMPRESSION: Mild streaky left basilar opacity, favor atelectasis. Electronically Signed   By: Duanne Guess D.O.   On: 02/08/2023 12:43     Assessment and Plan: No notes have been filed under this hospital service. Service: Hospitalist  CAP Patient presenting with a likely pneumonia that has persisted for the past 2-1/2 weeks and has a left lower lobe infiltrate on x-ray.  She has failed outpatient treatment with azithromycin and Augmentin.  On exam, she does have coarse lung sounds with mild wheezing at lung bases bilaterally. She is requiring 2L [place] to maintain saturations >92%.  Will rule out viral etiology for pneumonia though given duration of symptoms this may be a bacterial etiology.  She could possibly have a postviral cough, though would not expect her to continue to have oxygen requirements and leukocytosis.  No history of COPD or asthma per patient. -continue levaquin 750mg  daily, can transition to PO regimen pending clinical improvement -mucinex-dm BID -duonebs q2h prn given wheezing -Trend CBC -Droplet precautions -Follow-up respiratory viral panel, strep pneumonia urinary antigen, expectorated sputum assessment with Gram stain, blood cultures -Will avoid starting steroids given she has completed a course of steroids recently -Supplemental oxygen as needed to maintain O2 sats greater than 92%, wean as tolerated -Incentive spirometry -Lovenox for DVT prophylaxis -Place in  observation  Hypertension -Continue home lisinopril-hydrochlorothiazide 40-25 mg daily  HLD -continue home rosuvastatin 20mg  daily  Sinus Tachycardia Likely 2/2 infection. Will treat CAP as above, anticipate tachycardia will resolve as well. Also encouraged oral rehydration. -treat CAP as above -oral rehydration  Hypokalemia -repleting orally -f/u BMP tomorrow morning   Advance Care Planning:   Code Status: Full Code Confirmed with patient at bedside.  Consults: none  Family Communication: updated husband at bedside  Severity of Illness: The appropriate patient status for this patient is OBSERVATION. Observation status is judged to be reasonable and necessary in order to provide the required intensity of service to ensure the patient's safety. The patient's presenting symptoms, physical exam findings, and initial radiographic and laboratory data in the context of their medical condition is felt to place them at decreased risk for further clinical deterioration. Furthermore, it is anticipated that the patient will be medically stable for discharge from the hospital within 2 midnights of admission.   Author: Briscoe Burns, MD 02/08/2023 4:48 PM  For on call review [email or web address].

## 2023-02-08 NOTE — ED Triage Notes (Signed)
Pt arrived POV from home c/o [facility] x2 weeks. Pt states she got a Z-pack and it didn't work, she went back they put her on Augmentin and gave her an inhaler but today it is even worse.

## 2023-02-08 NOTE — Plan of Care (Signed)
  Problem: Respiratory: Goal: Ability to maintain a clear airway will improve Outcome: Progressing   Problem: Clinical Measurements: Goal: Will remain free from infection Outcome: Progressing   Problem: Nutrition: Goal: Adequate nutrition will be maintained Outcome: Progressing   Problem: Pain Management: Goal: General experience of comfort will improve Outcome: Progressing

## 2023-02-09 DIAGNOSIS — Z6836 Body mass index (BMI) 36.0-36.9, adult: Secondary | ICD-10-CM | POA: Diagnosis not present

## 2023-02-09 DIAGNOSIS — E785 Hyperlipidemia, unspecified: Secondary | ICD-10-CM | POA: Diagnosis present

## 2023-02-09 DIAGNOSIS — Z8709 Personal history of other diseases of the respiratory system: Secondary | ICD-10-CM | POA: Diagnosis not present

## 2023-02-09 DIAGNOSIS — Z7982 Long term (current) use of aspirin: Secondary | ICD-10-CM | POA: Diagnosis not present

## 2023-02-09 DIAGNOSIS — Z8719 Personal history of other diseases of the digestive system: Secondary | ICD-10-CM | POA: Diagnosis not present

## 2023-02-09 DIAGNOSIS — J9801 Acute bronchospasm: Secondary | ICD-10-CM | POA: Diagnosis not present

## 2023-02-09 DIAGNOSIS — Z9049 Acquired absence of other specified parts of digestive tract: Secondary | ICD-10-CM | POA: Diagnosis not present

## 2023-02-09 DIAGNOSIS — J984 Other disorders of lung: Secondary | ICD-10-CM | POA: Diagnosis not present

## 2023-02-09 DIAGNOSIS — Z87448 Personal history of other diseases of urinary system: Secondary | ICD-10-CM | POA: Diagnosis not present

## 2023-02-09 DIAGNOSIS — R0902 Hypoxemia: Secondary | ICD-10-CM | POA: Diagnosis present

## 2023-02-09 DIAGNOSIS — J9601 Acute respiratory failure with hypoxia: Secondary | ICD-10-CM | POA: Diagnosis not present

## 2023-02-09 DIAGNOSIS — I10 Essential (primary) hypertension: Secondary | ICD-10-CM | POA: Diagnosis not present

## 2023-02-09 DIAGNOSIS — Z789 Other specified health status: Secondary | ICD-10-CM | POA: Diagnosis not present

## 2023-02-09 DIAGNOSIS — E669 Obesity, unspecified: Secondary | ICD-10-CM | POA: Diagnosis present

## 2023-02-09 DIAGNOSIS — E876 Hypokalemia: Secondary | ICD-10-CM | POA: Diagnosis present

## 2023-02-09 DIAGNOSIS — R918 Other nonspecific abnormal finding of lung field: Secondary | ICD-10-CM | POA: Diagnosis not present

## 2023-02-09 DIAGNOSIS — J189 Pneumonia, unspecified organism: Secondary | ICD-10-CM | POA: Diagnosis present

## 2023-02-09 DIAGNOSIS — R0600 Dyspnea, unspecified: Secondary | ICD-10-CM | POA: Diagnosis not present

## 2023-02-09 DIAGNOSIS — R739 Hyperglycemia, unspecified: Secondary | ICD-10-CM | POA: Diagnosis present

## 2023-02-09 DIAGNOSIS — Z79899 Other long term (current) drug therapy: Secondary | ICD-10-CM | POA: Diagnosis not present

## 2023-02-09 DIAGNOSIS — Z7985 Long-term (current) use of injectable non-insulin antidiabetic drugs: Secondary | ICD-10-CM | POA: Diagnosis not present

## 2023-02-09 DIAGNOSIS — Z9889 Other specified postprocedural states: Secondary | ICD-10-CM | POA: Diagnosis not present

## 2023-02-09 DIAGNOSIS — Z803 Family history of malignant neoplasm of breast: Secondary | ICD-10-CM | POA: Diagnosis not present

## 2023-02-09 LAB — BASIC METABOLIC PANEL
Anion gap: 6 (ref 5–15)
BUN: 11 mg/dL (ref 8–23)
CO2: 29 mmol/L (ref 22–32)
Calcium: 8.6 mg/dL — ABNORMAL LOW (ref 8.9–10.3)
Chloride: 104 mmol/L (ref 98–111)
Creatinine, Ser: 1.01 mg/dL — ABNORMAL HIGH (ref 0.44–1.00)
GFR, Estimated: >60 mL/min (ref >60)
Glucose, Bld: 110 mg/dL — ABNORMAL HIGH (ref 70–99)
Potassium: 3.7 mmol/L (ref 3.5–5.1)
Sodium: 139 mmol/L (ref 135–145)

## 2023-02-09 LAB — GLUCOSE, CAPILLARY
Glucose-Capillary: 112 mg/dL — ABNORMAL HIGH (ref 70–99)
Glucose-Capillary: 150 mg/dL — ABNORMAL HIGH (ref 70–99)

## 2023-02-09 LAB — CBC
HCT: 37.6 % (ref 36.0–46.0)
Hemoglobin: 12.2 g/dL (ref 12.0–15.0)
MCH: 29.3 pg (ref 26.0–34.0)
MCHC: 32.4 g/dL (ref 30.0–36.0)
MCV: 90.2 fL (ref 80.0–100.0)
Platelets: 189 10*3/uL (ref 150–400)
RBC: 4.17 MIL/uL (ref 3.87–5.11)
RDW: 14.2 % (ref 11.5–15.5)
WBC: 13.8 10*3/uL — ABNORMAL HIGH (ref 4.0–10.5)
nRBC: 0 % (ref 0.0–0.2)

## 2023-02-09 LAB — EXPECTORATED SPUTUM ASSESSMENT W GRAM STAIN, RFLX TO RESP C

## 2023-02-09 MED ORDER — DM-GUAIFENESIN ER 30-600 MG PO TB12
2.0000 | ORAL_TABLET | Freq: Two times a day (BID) | ORAL | Status: DC
  Administered 2023-02-09 – 2023-02-12 (×6): 2 via ORAL
  Filled 2023-02-09 (×6): qty 2

## 2023-02-09 NOTE — Progress Notes (Signed)
PROGRESS NOTE  Melinda Stevens  DOB: March 10, 1956  PCP: Selinda Flavin, MD WUJ:[id number]  DOA: 02/08/2023  LOS: 0 days  Hospital Day: 2  Brief narrative: Melinda Stevens is a 67 y.o. female with PMH significant for HTN, bronchitis, diverticulosis 11/30, patient presented to the ED from home with complaint of dyspnea for 2 weeks not responding to antibiotics as an outpatient. In the span of the last 2 weeks, she was seen by her PCP, started on a course of azithromycin and steroid despite which she continued to have symptoms.  She was started on another course of Augmentin with no help either.  Mentions productive cough.  In the ED, patient was afebrile, heart rate in 100s, blood pressure 120s On exam, noted to have mild wheezing on both bases Labs with WC count 16.2, potassium 3.1 Resp virus panel unremarkable Chest x-ray with possible left lower lobe infiltrate Patient was started on Levaquin Admitted to [facility]  Subjective: Patient was seen and examined this morning.  Pleasant elderly Caucasian female.  Sitting up in bed.  Not in distress.  On 2 to 3 L oxygen nasal cannula.  Coughs deeply on deep breathing. Husband at bedside. Chart reviewed Overnight, afebrile, heart rate in 80s this morning, blood pressure in 120s, breathing on room air Labs this morning with WBC count slightly better at 13 point  Assessment and plan: Left lower lobe pneumonia Presented with shortness of breath, cough for 2 weeks and did not respond to 2 separate courses of azithromycin and Augmentin.   Chest x-ray with left lower lobe infiltrate Currently on Levaquin 750 mg daily Mucinex DM twice daily for cough Continue as needed bronchodilators Negative respiratory virus panel, sputum culture pending Continue incentive spirometry  Hypertension Continue home lisinopril-hydrochlorothiazide 40-25 mg daily   HLD continue rosuvastatin 20mg  daily   Sinus Tachycardia Secondary to infection.  Improved  overnight  Hypokalemia Potassium level was low and improved with replacement Recent Labs  Lab 02/08/23 1320 02/08/23 1327 02/09/23 0543  K 3.1* 3.1* 3.7   Hair loss On finasteride  Ozempic??   Mobility: Encourage ambulation  Goals of care   Code Status: Full Code     DVT prophylaxis:  enoxaparin (LOVENOX) injection 40 mg Start: 02/08/23 1800   Antimicrobials: Levaquin Fluid: None Consultants: None Family Communication: Husband at bedside  Status: Inpatient Level of care:  Med-Surg   Patient is from: Home Needs to continue in-hospital care: On IV antibiotics Anticipated d/c to: Hopefully home in 1 to 2 days   Diet:  Diet Order             Diet regular Room service appropriate? Yes; Fluid consistency: Thin  Diet effective now                   Scheduled Meds:  dextromethorphan-guaiFENesin  2 tablet Oral BID   enoxaparin (LOVENOX) injection  40 mg Subcutaneous Q24H   lisinopril  40 mg Oral Daily   And   hydrochlorothiazide  25 mg Oral Daily   rosuvastatin  20 mg Oral QPM    PRN meds: acetaminophen **OR** acetaminophen, ipratropium-albuterol   Infusions:   levofloxacin (LEVAQUIN) IV      Antimicrobials: Anti-infectives (From admission, onward)    Start     Dose/Rate Route Frequency Ordered Stop   02/09/23 1500  levofloxacin (LEVAQUIN) IVPB 750 mg        750 mg 100 mL/hr over 90 Minutes Intravenous Every 24 hours 02/08/23 1659 02/13/23 1459  02/08/23 1530  levofloxacin (LEVAQUIN) IVPB 750 mg        750 mg 100 mL/hr over 90 Minutes Intravenous  Once 02/08/23 1524 02/08/23 1712       Objective: Vitals:   02/09/23 0229 02/09/23 0823  BP: (!) 127/59 (!) 129/59  Pulse: 90 84  Resp: 18 16  Temp: 98.6 F (37 C) 97.9 F (36.6 C)  SpO2: 94% 96%    Intake/Output Summary (Last 24 hours) at 02/09/2023 1439 Last data filed at 02/08/2023 1744 Gross per 24 hour  Intake 143.47 ml  Output --  Net 143.47 ml   Filed Weights   02/08/23  1225 02/09/23 0229  Weight: 99.3 kg 98.4 kg   Weight change:  Body mass index is 36.1 kg/m.   Physical Exam: General exam: Pleasant elderly Caucasian female.  Not in distress at rest.  Coughs on deep breathing Skin: No rashes, lesions or ulcers. HEENT: Atraumatic, normocephalic, no obvious bleeding Lungs: Diffuse bronchial breath sounds.  Coughs on deep breathing breath. CVS: Regular rate and rhythm, no murmur GI/Abd soft, nontender, nondistended, bowel sound present CNS: Alert, awake, oriented x 3 Psychiatry: Mood appropriate Extremities: No pedal edema, no calf tenderness  Data Review: I have personally reviewed the laboratory data and studies available.  F/u labs  Unresulted Labs (From admission, onward)     Start     Ordered   02/15/23 0500  Creatinine, serum  (enoxaparin (LOVENOX)    CrCl >/= 30 ml/min)  Weekly,   R     Comments: while on enoxaparin therapy    02/08/23 1618   02/10/23 0500  Basic metabolic panel  Tomorrow morning,   R        02/09/23 1439   02/10/23 0500  CBC with Differential/Platelet  Tomorrow morning,   R        02/09/23 1439   02/08/23 1619  Respiratory (~20 pathogens) panel by PCR  (Respiratory panel by PCR (~20 pathogens, ~24 hr TAT)  w precautions)  Once,   R        02/08/23 1618   02/08/23 1617  Expectorated Sputum Assessment w Gram Stain, Rflx to Resp Cult  (COPD / Pneumonia / Cellulitis / Lower Extremity Wound)  Once,   R        02/08/23 1618            Total time spent in review of labs and imaging, patient evaluation, formulation of plan, documentation and communication with family: 45 minutes  Signed, Lorin Glass, MD [facility] 02/09/2023

## 2023-02-09 NOTE — Plan of Care (Signed)

## 2023-02-09 NOTE — Plan of Care (Signed)
  Problem: Clinical Measurements: Goal: Ability to maintain a body temperature in the normal range will improve Outcome: Progressing   Problem: Respiratory: Goal: Ability to maintain adequate ventilation will improve Outcome: Progressing Goal: Ability to maintain a clear airway will improve Outcome: Progressing   Problem: Health Behavior/Discharge Planning: Goal: Ability to manage health-related needs will improve Outcome: Progressing

## 2023-02-10 ENCOUNTER — Inpatient Hospital Stay (HOSPITAL_COMMUNITY): Payer: Medicare Other

## 2023-02-10 DIAGNOSIS — J189 Pneumonia, unspecified organism: Secondary | ICD-10-CM | POA: Diagnosis not present

## 2023-02-10 LAB — BASIC METABOLIC PANEL
Anion gap: 10 (ref 5–15)
BUN: 14 mg/dL (ref 8–23)
CO2: 30 mmol/L (ref 22–32)
Calcium: 8.7 mg/dL — ABNORMAL LOW (ref 8.9–10.3)
Chloride: 101 mmol/L (ref 98–111)
Creatinine, Ser: 0.93 mg/dL (ref 0.44–1.00)
GFR, Estimated: >60 mL/min (ref >60)
Glucose, Bld: 89 mg/dL (ref 70–99)
Potassium: 3.8 mmol/L (ref 3.5–5.1)
Sodium: 141 mmol/L (ref 135–145)

## 2023-02-10 LAB — CBC WITH DIFFERENTIAL/PLATELET
Abs Immature Granulocytes: 0.1 10*3/uL — ABNORMAL HIGH (ref 0.00–0.07)
Basophils Absolute: 0.1 10*3/uL (ref 0.0–0.1)
Basophils Relative: 1 %
Eosinophils Absolute: 0.7 10*3/uL — ABNORMAL HIGH (ref 0.0–0.5)
Eosinophils Relative: 6 %
HCT: 41.1 % (ref 36.0–46.0)
Hemoglobin: 13 g/dL (ref 12.0–15.0)
Immature Granulocytes: 1 %
Lymphocytes Relative: 15 %
Lymphs Abs: 1.9 10*3/uL (ref 0.7–4.0)
MCH: 28.9 pg (ref 26.0–34.0)
MCHC: 31.6 g/dL (ref 30.0–36.0)
MCV: 91.3 fL (ref 80.0–100.0)
Monocytes Absolute: 1.1 10*3/uL — ABNORMAL HIGH (ref 0.1–1.0)
Monocytes Relative: 8 %
Neutro Abs: 8.9 10*3/uL — ABNORMAL HIGH (ref 1.7–7.7)
Neutrophils Relative %: 69 %
Platelets: 219 10*3/uL (ref 150–400)
RBC: 4.5 MIL/uL (ref 3.87–5.11)
RDW: 14.4 % (ref 11.5–15.5)
WBC: 12.8 10*3/uL — ABNORMAL HIGH (ref 4.0–10.5)
nRBC: 0 % (ref 0.0–0.2)

## 2023-02-10 LAB — GLUCOSE, CAPILLARY: Glucose-Capillary: 83 mg/dL (ref 70–99)

## 2023-02-10 MED ORDER — LEVOFLOXACIN 750 MG PO TABS
750.0000 mg | ORAL_TABLET | Freq: Every day | ORAL | Status: AC
  Administered 2023-02-11 – 2023-02-12 (×2): 750 mg via ORAL
  Filled 2023-02-10 (×2): qty 1

## 2023-02-10 NOTE — Plan of Care (Signed)

## 2023-02-10 NOTE — Progress Notes (Signed)
PROGRESS NOTE  Melinda Stevens  DOB: [birth year]  PCP: Selinda Flavin, MD UJW:[id number]  DOA: 02/08/2023  LOS: 1 day  Hospital Day: 3  Brief narrative: Melinda Stevens is a 66 y.o. female with PMH significant for HTN, bronchitis, diverticulosis 11/30, patient presented to the ED from home with complaint of dyspnea for 2 weeks not responding to antibiotics as an outpatient. In the span of the last 2 weeks, she was seen by her PCP, started on a course of azithromycin and steroid despite which she continued to have symptoms.  She was started on another course of Augmentin with no help either.  Mentions productive cough.  In the ED, patient was afebrile, heart rate in 100s, blood pressure 120s On exam, noted to have mild wheezing on both bases Labs with WC count 16.2, potassium 3.1 Resp virus panel unremarkable Chest x-ray with possible left lower lobe infiltrate Patient was started on Levaquin Admitted to [facility]  Subjective: Patient was seen and examined this morning.   Sitting up at the edge of the bed.  Husband at bedside. Required supplemental oxygen on walking this morning. Continues to have cough on deep breathing.  On auscultation, continues to have diffuse bronchial sound like yesterday.  Assessment and plan: Left lower lobe pneumonia Presented with shortness of breath, cough for 2 weeks and did not respond to 2 separate courses of azithromycin and Augmentin.   Chest x-ray with left lower lobe infiltrate Currently on Levaquin 750 mg daily Required supplemental oxygen on ambulation this morning. On auscultation, patient continues to have diffuse bronchial breath sounds and coughs on deep breathing.  I think her auscultatory findings are disproportionate to his significant compared to relatively benign chest x-ray.  Will obtain CT chest for more information. Mucinex DM twice daily for cough Continue as needed bronchodilators Negative respiratory virus panel, sputum culture  pending Continue incentive spirometry May need home oxygen at discharge  Hypertension Continue home lisinopril-hydrochlorothiazide 40-25 mg daily   HLD continue rosuvastatin 20mg  daily   Sinus Tachycardia Secondary to infection.  Improved overnight  Hypokalemia Potassium level was low and improved with replacement Recent Labs  Lab 02/08/23 1320 02/08/23 1327 02/09/23 0543 02/10/23 0721  K 3.1* 3.1* 3.7 3.8   Hair loss On finasteride  Obesity  Body mass index is 35.95 kg/m. Patient has been advised to make an attempt to improve diet and exercise patterns to aid in weight loss. Currently on Ozempic weekly.   Mobility: Encourage ambulation  Goals of care   Code Status: Full Code     DVT prophylaxis:  enoxaparin (LOVENOX) injection 40 mg Start: 02/08/23 1800   Antimicrobials: Levaquin Fluid: None Consultants: None Family Communication: Husband at bedside  Status: Inpatient Level of care:  Med-Surg   Patient is from: Home Needs to continue in-hospital care: On IV antibiotics, pending CT chest today Anticipated d/c to: Hopefully home in 1 to 2 days   Diet:  Diet Order             Diet regular Room service appropriate? Yes; Fluid consistency: Thin  Diet effective now                   Scheduled Meds:  dextromethorphan-guaiFENesin  2 tablet Oral BID   enoxaparin (LOVENOX) injection  40 mg Subcutaneous Q24H   lisinopril  40 mg Oral Daily   And   hydrochlorothiazide  25 mg Oral Daily   rosuvastatin  20 mg Oral QPM    PRN meds: acetaminophen **  OR** acetaminophen, ipratropium-albuterol   Infusions:   levofloxacin (LEVAQUIN) IV 750 mg (02/10/23 1037)    Antimicrobials: Anti-infectives (From admission, onward)    Start     Dose/Rate Route Frequency Ordered Stop   02/09/23 1500  levofloxacin (LEVAQUIN) IVPB 750 mg        750 mg 100 mL/hr over 90 Minutes Intravenous Every 24 hours 02/08/23 1659 02/13/23 0959   02/08/23 1530  levofloxacin  (LEVAQUIN) IVPB 750 mg        750 mg 100 mL/hr over 90 Minutes Intravenous  Once 02/08/23 1524 02/08/23 1712       Objective: Vitals:   02/10/23 0440 02/10/23 0748  BP: 137/61 (!) 129/59  Pulse: 82 92  Resp: 18   Temp: (!) 97.5 F (36.4 C) (!) 97.3 F (36.3 C)  SpO2: 100% 96%    Intake/Output Summary (Last 24 hours) at 02/10/2023 1125 Last data filed at 02/09/2023 1614 Gross per 24 hour  Intake 104.43 ml  Output --  Net 104.43 ml   Filed Weights   02/08/23 1225 02/09/23 0229 02/10/23 0441  Weight: 99.3 kg 98.4 kg 98 kg   Weight change: -1.338 kg Body mass index is 35.95 kg/m.   Physical Exam: General exam: Pleasant elderly Caucasian female.  Not in distress at rest.  Coughs on deep breathing Skin: No rashes, lesions or ulcers. HEENT: Atraumatic, normocephalic, no obvious bleeding Lungs: Diffuse bronchial breath sounds bilaterally.  Coughs on deep breathing breath. CVS: Regular rate and rhythm, no murmur GI/Abd soft, nontender, nondistended, bowel sound present CNS: Alert, awake, oriented x 3 Psychiatry: Mood appropriate Extremities: No pedal edema, no calf tenderness  Data Review: I have personally reviewed the laboratory data and studies available.  F/u labs  Unresulted Labs (From admission, onward)     Start     Ordered   02/15/23 0500  Creatinine, serum  (enoxaparin (LOVENOX)    CrCl >/= 30 ml/min)  Weekly,   R     Comments: while on enoxaparin therapy    02/08/23 1618            Total time spent in review of labs and imaging, patient evaluation, formulation of plan, documentation and communication with family: 45 minutes  Signed, Lorin Glass, MD [facility] 02/10/2023

## 2023-02-10 NOTE — Progress Notes (Signed)
Mobility Specialist Progress Note:    02/10/23 0958  Mobility  Activity Ambulated independently in hallway  Level of Assistance Independent after set-up  Assistive Device None  Distance Ambulated (ft) 200 ft  Activity Response Tolerated well  Mobility Referral Yes  $Mobility charge 1 Mobility  Mobility Specialist Start Time (ACUTE ONLY) 0945  Mobility Specialist Stop Time (ACUTE ONLY) 0955  Mobility Specialist Time Calculation (min) (ACUTE ONLY) 10 min   RN requested walking O2 sat test. Pt received sitting EOB, agreeable to session. SpO2 97% on RA at rest, ambulated in hallway independently after set up. Pt had no c/o dizziness or SOB throughout session. SpO2 88% on RA, took one standing rest break to recover, SpO2 91% on RA. Returned pt to room, all needs met, RN notified, husband in room.   Feliciana Rossetti Mobility Specialist Please contact via Special educational needs teacher or  Rehab office at [phone]

## 2023-02-10 NOTE — Progress Notes (Signed)
This patient is receiving Levofloxacin by the intravenous route. Based on criteria approved by the Pharmacy and Therapeutics  Committee,  Levofloxacin is being converted to equivalent oral dose form.  These criteria include:  A. Has a documented ability to take oral medications (tolerating oral or gastric tube feedings for  >24 hours OR taking other scheduled oral medications for >24 hours).   B. Expected plan for continued treatment for at least 1 day.   C. Has documented clinical improvement; the patient must demonstrate:  1. 24-hour maximum temperature of <100.5 F  2. MD/patient assessment of improvement  If you have questions about this conversion, please contact the pharmacy department.  Thank you.  Cedric Fishman, PharmD, BCPS, BCCCP Clinical Pharmacist

## 2023-02-10 NOTE — Progress Notes (Signed)
Nurse requested Mobility Specialist to perform oxygen saturation test with pt which includes removing pt from oxygen both at rest and while ambulating.  Below are the results from that testing.     Patient Saturations on Room Air at Rest = spO2 97%  Patient Saturations on Room Air while Ambulating = sp02 88% .  Rested and performed pursed lip breathing for 1 minute with sp02 at 91%.  At end of testing pt left in room on 2  Liters of oxygen.  Reported results to nurse.   Feliciana Rossetti Mobility Specialist Please contact via Special educational needs teacher or  Rehab office at [phone]

## 2023-02-11 ENCOUNTER — Inpatient Hospital Stay (HOSPITAL_COMMUNITY): Payer: Medicare Other

## 2023-02-11 DIAGNOSIS — I10 Essential (primary) hypertension: Secondary | ICD-10-CM

## 2023-02-11 DIAGNOSIS — J9801 Acute bronchospasm: Secondary | ICD-10-CM

## 2023-02-11 DIAGNOSIS — J189 Pneumonia, unspecified organism: Secondary | ICD-10-CM | POA: Diagnosis not present

## 2023-02-11 LAB — ECHOCARDIOGRAM COMPLETE
AR max vel: 2.66 cm2
AV Area VTI: 2.84 cm2
AV Area mean vel: 2.6 cm2
AV Mean grad: 6 mm[Hg]
AV Peak grad: 10.5 mm[Hg]
Ao pk vel: 1.62 m/s
Area-P 1/2: 4.86 cm2
Height: 65 in
S' Lateral: 3 cm
Weight: 3485.03 [oz_av]

## 2023-02-11 LAB — PROCALCITONIN: Procalcitonin: <0.1 ng/mL

## 2023-02-11 LAB — GLUCOSE, CAPILLARY: Glucose-Capillary: 101 mg/dL — ABNORMAL HIGH (ref 70–99)

## 2023-02-11 MED ORDER — METHYLPREDNISOLONE SODIUM SUCC 125 MG IJ SOLR
120.0000 mg | Freq: Every day | INTRAMUSCULAR | Status: DC
  Administered 2023-02-11 – 2023-02-12 (×2): 120 mg via INTRAVENOUS
  Filled 2023-02-11 (×3): qty 2

## 2023-02-11 MED ORDER — LABETALOL HCL 5 MG/ML IV SOLN
10.0000 mg | INTRAVENOUS | Status: DC | PRN
  Administered 2023-02-11: 10 mg via INTRAVENOUS
  Filled 2023-02-11: qty 4

## 2023-02-11 MED ORDER — IPRATROPIUM-ALBUTEROL 0.5-2.5 (3) MG/3ML IN SOLN
3.0000 mL | Freq: Four times a day (QID) | RESPIRATORY_TRACT | Status: DC
  Administered 2023-02-11 – 2023-02-12 (×4): 3 mL via RESPIRATORY_TRACT
  Filled 2023-02-11 (×6): qty 3

## 2023-02-11 MED ORDER — LABETALOL HCL 5 MG/ML IV SOLN
10.0000 mg | INTRAVENOUS | Status: DC | PRN
Start: 2023-02-11 — End: 2023-02-11

## 2023-02-11 NOTE — Progress Notes (Signed)
    Durable Medical Equipment  (From admission, onward)           Start     Ordered   02/11/23 0813  For home use only DME oxygen  Once       Question Answer Comment  Length of Need Lifetime   Mode or (Route) Nasal cannula   Liters per Minute 2   Frequency Continuous (stationary and portable oxygen unit needed)   Oxygen conserving device Yes   Oxygen delivery system Gas      02/11/23 [phone]

## 2023-02-11 NOTE — Progress Notes (Addendum)
TRH night cross cover note:   I was notified by RN of the patient's most recent VS, which include  BP 163/68, HR 116.   I subsequently ordered prn IV labetalol for systolic blood pressure greater than 180 mmHg.  Will allow for permissive sinus tachycardia and in the setting of her presenting infection, noting that she was admitted for pneumonia.   Will refrain from aggressive anti-hypertensive intervention given her presenting infxn.       Newton Pigg, DO Hospitalist

## 2023-02-11 NOTE — Progress Notes (Addendum)
Transition of Care [facility]) - Inpatient Brief Assessment   Patient Details  Name: Melinda Stevens MRN: [id number] Date of Birth: [birth year]  Transition of Care [facility]) CM/SW Contact:    Janae Bridgeman, RN Phone Number: 02/11/2023, 12:10 PM   Clinical Narrative: CM met with the patient and spouse at the bedside to discuss TOC needs.  The patient admitted with simple pneumonia and is requiring 2L/min  oxygen at this time.    Patient is a non-smoker and currently has no DME at the home.  Patient was provided with choice regarding DME company for the home oxygen and she does not have a preference.  I will call [employer] and have portable oxygen tank delivered to the bedside and the company will follow up with patient for provision of the oxygen concentrator at the home with the patient is discharged.  Dr. Vassie Loll, Pulmonology MD is at the bedside at this time for consult.  02/10/23 1217 - I called and spoke with Lorelle Gibbs, CM with [employer] company and they plan to deliver portable oxygen to the bedside in the next 1-2 hours.  No other TOC needs at this time.   Transition of Care Asessment: Insurance and Status: (P) Insurance coverage has been reviewed Patient has primary care physician: (P) Yes Home environment has been reviewed: (P) from home with spouse Prior level of function:: (P) Independent Prior/Current Home Services: (P) No current home services Social Determinants of Health Reivew: (P) SDOH reviewed interventions complete Readmission risk has been reviewed: (P) Yes Transition of care needs: (P) transition of care needs identified, TOC will continue to follow

## 2023-02-11 NOTE — Consult Note (Signed)
NAME:  Melinda Stevens, MRN:  [id number], DOB:  [birth year], LOS: 2 ADMISSION DATE:  02/08/2023, CONSULTATION DATE: 02/11/2023 REFERRING MD: Sedalia Muta, CHIEF COMPLAINT: Persistent hypoxia  History of Present Illness:  67 year old female never smoker who reports having his sinus symptoms on a biannual basis for which she usually gets treated with antibiotics and prednisone.  She was treated with the usual pharmaceutical regimen earlier and failed to respond.  She continued to have postnasal drip, increasing cough along with increasing shortness of breath.  She is admitted to the hospital CT scan showed multiple lobar groundglass opacities consistent with pneumonia.  She had been treated with antibiotics and oxygen to keep her sats greater than 88%.  Despite these interventions she continues to be hypoxic and pulmonary critical care is asked to evaluate.  Pertinent  Medical History   Past Medical History:  Diagnosis Date   Diverticulitis    Hypertension    Renal disorder      Significant Hospital Events: Including procedures, antibiotic start and stop dates in addition to other pertinent events     Interim History / Subjective:  Reports feeling better  Objective   Blood pressure (!) 142/38, pulse (!) 103, temperature 98.7 F (37.1 C), temperature source Oral, resp. rate 18, height 5\' 5"  (1.651 m), weight 98.8 kg, SpO2 95%.        Intake/Output Summary (Last 24 hours) at 02/11/2023 1123 Last data filed at 02/10/2023 1300 Gross per 24 hour  Intake 237 ml  Output --  Net 237 ml   Filed Weights   02/09/23 0229 02/10/23 0441 02/11/23 0550  Weight: 98.4 kg 98 kg 98.8 kg    Examination: General: Well-nourished well-developed female no acute distress HENT: Reports postnasal drainage, no JVD is appreciated Lungs: Coarse rhonchi with faint wheezes in the bases Cardiovascular: Sounds regular regular rate and rhythm Abdomen: Soft nontender positive bowel sounds Extremities: Warm dry  without edema Neuro: Grossly intact without focal defect GU: Voids  Resolved Hospital Problem list     Assessment & Plan:  67 year old retired female never smoker who for seasonal allergies twice year that usually require treatment for bronchitis.  She was treated for bronchitis earlier in the year but never improved.  She had brown sputum along with general malaise and shortness of breath.  She had failed outpatient treatment was admitted to the hospital and treated for pneumonia and with a CT scan showing multiple groundglass opacities.  She has improved somewhat but remains O2 dependent and pulmonary critical care is asked to evaluate.  She did receive  prednisone.  Agree with antimicrobial therapy Pulmonary toilet Agree with mucolytics Continue to wean O2 as tolerated, currently on 2 L nasal cannula with sats of 95% Consider nasal hygiene for sinus-like symptoms Consider nasal spray Consider CT of the sinuses Consider 2D echo Consider follow-up with pulmonary as an outpatient  All other issues per primary   Best Practice (right click and "Reselect all SmartList Selections" daily)   Diet/type: Regular consistency (see orders) DVT prophylaxis: enoxaparin (LOVENOX) injection 40 mg Start: 02/08/23 1800   Pressure ulcer(s): not present on admission  GI prophylaxis: PPI Lines: N/A Foley:  N/A Code Status:  full code Last date of multidisciplinary goals of care discussion [tbd]  Labs   CBC: Recent Labs  Lab 02/08/23 1320 02/08/23 1327 02/09/23 0543 02/10/23 0721  WBC 16.2*  --  13.8* 12.8*  NEUTROABS  --   --   --  8.9*  HGB 13.7 14.3 12.2 13.0  HCT 41.2 42.0 37.6 41.1  MCV 90.2  --  90.2 91.3  PLT 197  --  189 219    Basic Metabolic Panel: Recent Labs  Lab 02/08/23 1320 02/08/23 1327 02/09/23 0543 02/10/23 0721  NA 141 141 139 141  K 3.1* 3.1* 3.7 3.8  CL 102  --  104 101  CO2 30  --  29 30  GLUCOSE 135*  --  110* 89  BUN 8  --  11 14  CREATININE 0.98  --   1.01* 0.93  CALCIUM 9.4  --  8.6* 8.7*   GFR: Estimated Creatinine Clearance: 68.3 mL/min (by C-G formula based on SCr of 0.93 mg/dL). Recent Labs  Lab 02/08/23 1320 02/09/23 0543 02/10/23 0721  PROCALCITON  --   --  <0.10  WBC 16.2* 13.8* 12.8*    Liver Function Tests: No results for input(s): "AST", "ALT", "ALKPHOS", "BILITOT", "PROT", "ALBUMIN" in the last 168 hours. No results for input(s): "LIPASE", "AMYLASE" in the last 168 hours. No results for input(s): "AMMONIA" in the last 168 hours.  ABG    Component Value Date/Time   HCO3 32.0 (H) 02/08/2023 1327   TCO2 33 (H) 02/08/2023 1327   O2SAT 73 02/08/2023 1327     Coagulation Profile: No results for input(s): "INR", "PROTIME" in the last 168 hours.  Cardiac Enzymes: No results for input(s): "CKTOTAL", "CKMB", "CKMBINDEX", "TROPONINI" in the last 168 hours.  HbA1C: No results found for: "HGBA1C"  CBG: Recent Labs  Lab 02/09/23 0602 02/09/23 0804 02/10/23 0749 02/11/23 0616  GLUCAP 112* 150* 83 101*    Review of Systems:   10 point review of system taken, please see HPI for positives and negatives.   Past Medical History:  She,  has a past medical history of Diverticulitis, Hypertension, and Renal disorder.   Surgical History:   Past Surgical History:  Procedure Laterality Date   BREAST EXCISIONAL BIOPSY Left    COLON SURGERY     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     Social History:   reports that she has never smoked. She has never used smokeless tobacco. She reports that she does not currently use alcohol. She reports that she does not currently use drugs.   Family History:  Her family history includes Breast cancer in her maternal aunt and sister.   Allergies No Known Allergies   Home Medications  Prior to Admission medications   Medication Sig Start Date End Date Taking? Authorizing Provider  acetaminophen (TYLENOL) 500 MG tablet Take 500 mg by mouth every 6 (six)  hours as needed for mild pain or moderate pain.   Yes [provider]  albuterol (VENTOLIN HFA) 108 (90 Base) MCG/ACT inhaler Inhale 1-2 puffs into the lungs every 4 (four) hours as needed for wheezing or shortness of breath. 02/01/23  Yes [provider]  amLODipine (NORVASC) 2.5 MG tablet Take 2.5 mg by mouth daily. 01/13/23  Yes [provider]  clobetasol (TEMOVATE) 0.05 % external solution Apply 1 Application topically daily. 02/04/23  Yes [provider]  finasteride (PROSCAR) 5 MG tablet Take 2.5 mg by mouth daily.   Yes [provider]  lisinopril-hydrochlorothiazide (PRINZIDE,ZESTORETIC) 20-12.5 MG tablet Take 2 tablets by mouth daily.   Yes [provider]  Multiple Vitamin (MULTIVITAMIN WITH MINERALS) TABS tablet Take 1 tablet by mouth daily.   Yes [provider]  Multiple Vitamins-Minerals (ICAPS) TABS Take 1 tablet by mouth daily.   Yes [provider]  OZEMPIC, 1 MG/DOSE, 4 MG/3ML SOPN Inject 1 mg into the skin once a week. Wednesdays 01/28/23  Yes [provider]  rosuvastatin (CRESTOR) 20 MG tablet Take 20 mg by mouth every evening. 12/31/22  Yes [provider]     Critical care time: Elizebeth Brooking Melissa Tomaselli ACNP Acute Care Nurse Practitioner Adolph Pollack Pulmonary/Critical Care Please consult Amion 02/11/2023, 11:23 AM

## 2023-02-11 NOTE — Plan of Care (Signed)

## 2023-02-11 NOTE — Progress Notes (Addendum)
PROGRESS NOTE  Melinda Stevens  DOB: [birth year]  PCP: Selinda Flavin, MD LOV:[id number]  DOA: 02/08/2023  LOS: 2 days  Hospital Day: 4  Brief narrative: Melinda Stevens is a 67 y.o. female with PMH significant for HTN, bronchitis, diverticulosis 11/30, patient presented to the ED from home with complaint of dyspnea for 2 weeks not responding to antibiotics as an outpatient. In the span of the last 2 weeks, she was seen by her PCP, started on a course of azithromycin and steroid despite which she continued to have symptoms.  She was started on another course of Augmentin with no help either.  Mentions productive cough.  In the ED, patient was afebrile, heart rate in 100s, blood pressure 120s On exam, noted to have mild wheezing on both bases Labs with WC count 16.2, potassium 3.1 Resp virus panel unremarkable Chest x-ray with possible left lower lobe infiltrate Patient was started on Levaquin Admitted to [facility]  Subjective: Patient was seen and examined this morning.   Sitting up at the edge of the bed.  Husband at bedside. Remains on supplemental oxygen. Pulmonary consulted for abnormal CT scan.  Assessment and plan: Multifocal pneumonia Presented with shortness of breath, cough for 2 weeks and did not respond to 2 separate courses of azithromycin and Augmentin.   Chest x-ray with left lower lobe infiltrate Currently on Levaquin 750 mg daily WBC count mildly elevated.  Procalcitonin level not elevated. CT chest 12/2 showed groundglass density within the left lower lobe as well as right middle lobe right lower lobe and left upper lobe. On auscultation, patient continues to have diffuse bronchial breath sounds and coughs on deep breathing.  Pulmonary consulted  Recommended to continue Levaquin.  Also ordered for IV Solu-Medrol 40 mg every 8 hours and DuoNeb every 6 hours. Continue Mucinex DM twice daily for cough Negative respiratory virus panel Continue incentive spirometry May  need home oxygen at discharge Recent Labs  Lab 02/08/23 1320 02/09/23 0543 02/10/23 0721  WBC 16.2* 13.8* 12.8*  PROCALCITON  --   --  <0.10   Hypertension Continue home lisinopril-hydrochlorothiazide 40-25 mg daily   HLD continue rosuvastatin 20mg  daily   Sinus Tachycardia Secondary to infection.  Improved overnight  Hair loss On finasteride  Obesity  Body mass index is 36.25 kg/m. Patient has been advised to make an attempt to improve diet and exercise patterns to aid in weight loss. Currently on Ozempic weekly.   Mobility: Encourage ambulation  Goals of care   Code Status: Full Code     DVT prophylaxis:  enoxaparin (LOVENOX) injection 40 mg Start: 02/08/23 1800   Antimicrobials: Levaquin Fluid: None Consultants: None Family Communication: Husband at bedside  Status: Inpatient Level of care:  Med-Surg   Patient is from: Home Needs to continue in-hospital care: On IV steroids. Anticipated d/c to: Hopefully home in 1 to 2 days   Diet:  Diet Order             Diet regular Room service appropriate? Yes; Fluid consistency: Thin  Diet effective now                   Scheduled Meds:  dextromethorphan-guaiFENesin  2 tablet Oral BID   enoxaparin (LOVENOX) injection  40 mg Subcutaneous Q24H   lisinopril  40 mg Oral Daily   And   hydrochlorothiazide  25 mg Oral Daily   ipratropium-albuterol  3 mL Nebulization QID   levofloxacin  750 mg Oral Daily   methylPREDNISolone (SOLU-MEDROL)  injection  120 mg Intravenous Daily   rosuvastatin  20 mg Oral QPM    PRN meds: acetaminophen **OR** acetaminophen   Infusions:     Antimicrobials: Anti-infectives (From admission, onward)    Start     Dose/Rate Route Frequency Ordered Stop   02/11/23 1000  levofloxacin (LEVAQUIN) tablet 750 mg        750 mg Oral Daily 02/10/23 1333 02/13/23 0959   02/09/23 1500  levofloxacin (LEVAQUIN) IVPB 750 mg  Status:  Discontinued        750 mg 100 mL/hr over 90 Minutes  Intravenous Every 24 hours 02/08/23 1659 02/10/23 1333   02/08/23 1530  levofloxacin (LEVAQUIN) IVPB 750 mg        750 mg 100 mL/hr over 90 Minutes Intravenous  Once 02/08/23 1524 02/08/23 1712       Objective: Vitals:   02/11/23 0851 02/11/23 1402  BP: (!) 142/38   Pulse: (!) 103   Resp: 18   Temp: 98.7 F (37.1 C)   SpO2: 95% 97%   No intake or output data in the 24 hours ending 02/11/23 1424  Filed Weights   02/09/23 0229 02/10/23 0441 02/11/23 0550  Weight: 98.4 kg 98 kg 98.8 kg   Weight change: 0.8 kg Body mass index is 36.25 kg/m.   Physical Exam: General exam: Pleasant elderly Caucasian female.  Not in distress at rest.   Skin: No rashes, lesions or ulcers. HEENT: Atraumatic, normocephalic, no obvious bleeding Lungs: Continues to have diffuse bronchial breath sounds bilaterally.  Coughs on deep breathing breath. CVS: Regular rate and rhythm, no murmur GI/Abd soft, nontender, nondistended, bowel sound present CNS: Alert, awake, oriented x 3 Psychiatry: Mood appropriate Extremities: No pedal edema, no calf tenderness  Data Review: I have personally reviewed the laboratory data and studies available.  F/u labs  Unresulted Labs (From admission, onward)     Start     Ordered   02/15/23 0500  Creatinine, serum  (enoxaparin (LOVENOX)    CrCl >/= 30 ml/min)  Weekly,   R     Comments: while on enoxaparin therapy    02/08/23 1618            Total time spent in review of labs and imaging, patient evaluation, formulation of plan, documentation and communication with family: 45 minutes  Signed, Lorin Glass, MD [facility] 02/11/2023

## 2023-02-11 NOTE — Progress Notes (Signed)
  Echocardiogram 2D Echocardiogram has been performed.  Melinda Stevens 02/11/2023, 3:10 PM

## 2023-02-11 NOTE — Progress Notes (Signed)
Mobility Specialist Progress Note:    02/11/23 0840  Mobility  Activity Ambulated independently in hallway  Level of Assistance Independent after set-up  Assistive Device None  Distance Ambulated (ft) 200 ft  Activity Response Tolerated well  Mobility Referral Yes  $Mobility charge 1 Mobility  Mobility Specialist Start Time (ACUTE ONLY) 0840  Mobility Specialist Stop Time (ACUTE ONLY) 0847  Mobility Specialist Time Calculation (min) (ACUTE ONLY) 7 min   Pt received sitting EOB, agreeable to mobility session.  SpO2 94% on RA at rest, ambulated in hallway. SpO2 85% on RA, took one standing rest break with pursed lip breathing. Increased O2 flow to 0.25L, SpO2 91% on 0.25L. Returned pt to room, SpO2 95% on 2L. Left with all needs met, call bell in reach.   Feliciana Rossetti Mobility Specialist Please contact via Special educational needs teacher or  Rehab office at [phone]

## 2023-02-11 NOTE — Progress Notes (Signed)
Nurse requested Mobility Specialist to perform oxygen saturation test with pt which includes removing pt from oxygen both at rest and while ambulating.  Below are the results from that testing.     Patient Saturations on Room Air at Rest = spO2 94%  Patient Saturations on Room Air while Ambulating = sp02 87% .  Rested and performed pursed lip breathing for 1 minute with sp02 at 85%.  Patient Saturations on 0.25 Liters of oxygen while Ambulating = sp02 91%  At end of testing pt left in room on 2  Liters of oxygen.  Reported results to nurse.   Melinda Stevens Mobility Specialist Please contact via Special educational needs teacher or  Rehab office at [phone]

## 2023-02-12 ENCOUNTER — Other Ambulatory Visit (HOSPITAL_COMMUNITY): Payer: Self-pay

## 2023-02-12 ENCOUNTER — Telehealth: Payer: Self-pay | Admitting: Pulmonary Disease

## 2023-02-12 DIAGNOSIS — J9601 Acute respiratory failure with hypoxia: Secondary | ICD-10-CM | POA: Diagnosis not present

## 2023-02-12 DIAGNOSIS — J189 Pneumonia, unspecified organism: Secondary | ICD-10-CM | POA: Diagnosis not present

## 2023-02-12 DIAGNOSIS — J9801 Acute bronchospasm: Secondary | ICD-10-CM | POA: Diagnosis not present

## 2023-02-12 DIAGNOSIS — I10 Essential (primary) hypertension: Secondary | ICD-10-CM

## 2023-02-12 DIAGNOSIS — E669 Obesity, unspecified: Secondary | ICD-10-CM

## 2023-02-12 LAB — GLUCOSE, CAPILLARY: Glucose-Capillary: 138 mg/dL — ABNORMAL HIGH (ref 70–99)

## 2023-02-12 LAB — CULTURE, RESPIRATORY W GRAM STAIN

## 2023-02-12 MED ORDER — IPRATROPIUM-ALBUTEROL 0.5-2.5 (3) MG/3ML IN SOLN: 3.0000 mL | RESPIRATORY_TRACT | Status: DC | PRN

## 2023-02-12 MED ORDER — PREDNISONE 20 MG PO TABS: 40.0000 mg | ORAL_TABLET | Freq: Every day | ORAL | Status: DC

## 2023-02-12 MED ORDER — LEVOFLOXACIN 750 MG PO TABS
750.0000 mg | ORAL_TABLET | Freq: Every day | ORAL | 0 refills | Status: AC
  Filled 2023-02-12: qty 5, 5d supply, fill #0

## 2023-02-12 MED ORDER — ALBUTEROL SULFATE HFA 108 (90 BASE) MCG/ACT IN AERS
1.0000 | INHALATION_SPRAY | RESPIRATORY_TRACT | 0 refills | Status: AC | PRN
  Filled 2023-02-12: qty 18, 30d supply, fill #0

## 2023-02-12 MED ORDER — PREDNISONE 20 MG PO TABS
40.0000 mg | ORAL_TABLET | Freq: Every day | ORAL | 0 refills | Status: AC
  Filled 2023-02-12: qty 10, 5d supply, fill #0

## 2023-02-12 MED ORDER — IPRATROPIUM-ALBUTEROL 0.5-2.5 (3) MG/3ML IN SOLN
3.0000 mL | RESPIRATORY_TRACT | 0 refills | Status: AC | PRN
  Filled 2023-02-12: qty 450, 25d supply, fill #0

## 2023-02-12 NOTE — Telephone Encounter (Signed)
Ya do 9am and write in apt note ok'd by me

## 2023-02-12 NOTE — Progress Notes (Signed)
Nurse requested Mobility Specialist to perform oxygen saturation test with pt which includes removing pt from oxygen both at rest and while ambulating.  Below are the results from that testing.     Patient Saturations on Room Air at Rest = spO2 94%  Patient Saturations on Room Air while Ambulating = sp02 90% .   Patient Saturations on N/A Liters of oxygen while Ambulating = sp02 N/A%  At end of testing pt left in room on RA  Reported results to nurse.   Melinda Stevens Mobility Specialist Please contact via SecureChat or  Rehab office at [phone]

## 2023-02-12 NOTE — Assessment & Plan Note (Signed)
02-12-2023 stable on home meds.

## 2023-02-12 NOTE — Plan of Care (Signed)

## 2023-02-12 NOTE — Telephone Encounter (Signed)
If I am not already, max is 14 if ok'd by me

## 2023-02-12 NOTE — Assessment & Plan Note (Signed)
Since admission until 02-11-2023. Pt was requiring supplemental O2. As high as 2 L/min.  02-12-2023 pt weaned to RA. Will not need home O2.

## 2023-02-12 NOTE — Progress Notes (Signed)
PROGRESS NOTE    Melinda Stevens  UJW:[id number] DOB: [birth year] DOA: 02/08/2023 PCP: Selinda Flavin, MD  Subjective: Pt seen and examined. She states she feels so much better. No wheezing. No fevers. Ready to go home.   Hospital Course: HPI: Melinda Stevens is a 67 y.o. female with medical history significant of HTN and bronchitis presenting with complaints of cough, congestion, and dyspnea.  She states that she began having a cough about 2-1/2 weeks ago.  She went to see her primary care doctor and was provided a course of azithromycin along with steroids.  However even after completing course of azithromycin and steroids, her symptoms did not resolve.  She again went to see her primary care doctor and was prescribed Augmentin along with an albuterol inhaler.  She completed her Augmentin therapy without any notable improvement in her symptoms.  She reports persistence of her cough, congestion, dyspnea.  States that her cough is productive of greenish colored sputum.  She also states that her dyspnea is present at rest along with exertion.  She denies any fevers, chills, nausea, vomiting, chest pain, palpitations, diarrhea, or any urinary symptoms.   ED course: CBC with leukocytosis (16.2).  BMP with mild hypokalemia and mild hyperglycemia, otherwise stable kidney function.  Chest x-ray does show likely left lower lobe infiltrate.  She was started on Levaquin per ED provider given failure of azithromycin and Augmentin in the outpatient setting.  [facility] hospital[facility] asked to evaluate patient for admission.  Significant Events: Admitted 02/08/2023 for pneumonia   Significant Labs: Admission WBC 16.2, K 3.1  Significant Imaging Studies: Admission CT chest Ground-glass density within the left lower lobe with additional foci of mild ground-glass density in the right middle lobe, right lower lobe and left upper lobe. Findings could be secondary to multifocal infectious pneumonia but could also consider  organizing pneumonia or other inflammatory lung disease. Imaging follow-up to resolution is also recommended to exclude neoplasm. 2. Borderline mediastinal lymph nodes, likely reactive.  Antibiotic Therapy: Anti-infectives (From admission, onward)    Start     Dose/Rate Route Frequency Ordered Stop   02/11/23 1000  levofloxacin (LEVAQUIN) tablet 750 mg        750 mg Oral Daily 02/10/23 1333 02/13/23 0959   02/09/23 1500  levofloxacin (LEVAQUIN) IVPB 750 mg  Status:  Discontinued        750 mg 100 mL/hr over 90 Minutes Intravenous Every 24 hours 02/08/23 1659 02/10/23 1333   02/08/23 1530  levofloxacin (LEVAQUIN) IVPB 750 mg        750 mg 100 mL/hr over 90 Minutes Intravenous  Once 02/08/23 1524 02/08/23 1712       Procedures:   Consultants: PCCM    Assessment and Plan: * CAP (community acquired pneumonia) Since admission until 02-11-2023 Presented with shortness of breath, cough for 2 weeks and did not respond to 2 separate courses of azithromycin and Augmentin.   Chest x-ray with left lower lobe infiltrate Currently on Levaquin 750 mg daily WBC count mildly elevated.  Procalcitonin level not elevated. CT chest 12/2 showed groundglass density within the left lower lobe as well as right middle lobe right lower lobe and left upper lobe. On auscultation, patient continues to have diffuse bronchial breath sounds and coughs on deep breathing.  Pulmonary consulted  Recommended to continue Levaquin.  Also ordered for IV Solu-Medrol 40 mg every 8 hours and DuoNeb every 6 hours. Continue Mucinex DM twice daily for cough Negative respiratory virus panel Continue incentive  spirometry May need home oxygen at discharge 02-12-2023 thanks for pulmonary consult. Pt feels much better and requesting DC to home. Pt has been weaned off O2. Per RN, pt was told by PCCM that she can go home. Will arrange for home nebulizer machine.  Acute bronchospasm 02-12-2023 was treated with IV steroids and  nebulizer treatments. Will arrange for home neb machine and complete 5 days of po prednisone at home. 40 mg every day.  Acute respiratory failure with hypoxia (HCC) Since admission until 02-11-2023. Pt was requiring supplemental O2. As high as 2 L/min.  02-12-2023 pt weaned to RA. Will not need home O2.  Essential hypertension 02-12-2023 stable on home meds.  Obesity (BMI 30-39.9) 02-12-2023 BMI 36.06   DVT prophylaxis: enoxaparin (LOVENOX) injection 40 mg Start: 02/08/23 1800     Code Status: Full Code Family Communication: no family at bedside. Pt is decisional Disposition Plan: return home Reason for continuing need for hospitalization: medically stable for discharge.  Objective: Vitals:   02/11/23 2256 02/12/23 0349 02/12/23 0733 02/12/23 0751  BP: (!) 115/55 123/60  (!) 130/40  Pulse: (!) 102 95  (!) 101  Resp: 18 18  18   Temp:  98.1 F (36.7 C)  99.1 F (37.3 C)  TempSrc:  Oral  Oral  SpO2: 97% 96% 96% 96%  Weight:  98.3 kg    Height:        Intake/Output Summary (Last 24 hours) at 02/12/2023 1325 Last data filed at 02/12/2023 1233 Gross per 24 hour  Intake 118 ml  Output --  Net 118 ml   Filed Weights   02/10/23 0441 02/11/23 0550 02/12/23 0349  Weight: 98 kg 98.8 kg 98.3 kg    Examination:  Physical Exam Vitals and nursing note reviewed.  Constitutional:      General: She is not in acute distress.    Appearance: She is obese. She is not toxic-appearing or diaphoretic.  HENT:     Head: Normocephalic and atraumatic.     Nose: Nose normal.  Eyes:     General: No scleral icterus. Cardiovascular:     Rate and Rhythm: Normal rate and regular rhythm.  Pulmonary:     Effort: Pulmonary effort is normal.     Breath sounds: Normal breath sounds.     Comments: Small squeak with inspiration only in left upper lobe. Otherwise clear and no wheezing Abdominal:     General: Bowel sounds are normal. There is no distension.     Palpations: Abdomen is soft.   Musculoskeletal:     Right lower leg: No edema.     Left lower leg: No edema.  Skin:    General: Skin is warm and dry.     Capillary Refill: Capillary refill takes less than 2 seconds.  Neurological:     General: No focal deficit present.     Mental Status: She is alert and oriented to person, place, and time.     Data Reviewed: I have personally reviewed following labs and imaging studies  CBC: Recent Labs  Lab 02/08/23 1320 02/08/23 1327 02/09/23 0543 02/10/23 0721  WBC 16.2*  --  13.8* 12.8*  NEUTROABS  --   --   --  8.9*  HGB 13.7 14.3 12.2 13.0  HCT 41.2 42.0 37.6 41.1  MCV 90.2  --  90.2 91.3  PLT 197  --  189 219   Basic Metabolic Panel: Recent Labs  Lab 02/08/23 1320 02/08/23 1327 02/09/23 0543 02/10/23 0721  NA 141  141 139 141  K 3.1* 3.1* 3.7 3.8  CL 102  --  104 101  CO2 30  --  29 30  GLUCOSE 135*  --  110* 89  BUN 8  --  11 14  CREATININE 0.98  --  1.01* 0.93  CALCIUM 9.4  --  8.6* 8.7*   GFR: Estimated Creatinine Clearance: 68.1 mL/min (by C-G formula based on SCr of 0.93 mg/dL). CBG: Recent Labs  Lab 02/09/23 0602 02/09/23 0804 02/10/23 0749 02/11/23 0616 02/12/23 0604  GLUCAP 112* 150* 83 101* 138*   Sepsis Labs: Recent Labs  Lab 02/10/23 0721  PROCALCITON <0.10    Recent Results (from the past 240 hour(s))  Resp panel by RT-PCR (RSV, Flu A&B, Covid) Anterior Nasal Swab     Status: None   Collection Time: 02/08/23  1:03 PM   Specimen: Anterior Nasal Swab  Result Value Ref Range Status   SARS Coronavirus 2 by RT PCR NEGATIVE NEGATIVE Final   Influenza A by PCR NEGATIVE NEGATIVE Final   Influenza B by PCR NEGATIVE NEGATIVE Final    Comment: (NOTE) The Xpert Xpress SARS-CoV-2/FLU/RSV plus assay is intended as an aid in the diagnosis of influenza from Nasopharyngeal swab specimens and should not be used as a sole basis for treatment. Nasal washings and aspirates are unacceptable for Xpert Xpress  SARS-CoV-2/FLU/RSV testing.  Fact Sheet for Patients: BloggerCourse.com  Fact Sheet for Healthcare Providers: SeriousBroker.it  This test is not yet approved or cleared by the [place] FDA and has been authorized for detection and/or diagnosis of SARS-CoV-2 by FDA under an Emergency Use Authorization (EUA). This EUA will remain in effect (meaning this test can be used) for the duration of the COVID-19 declaration under Section 564(b)(1) of the Act, 21 U.S.C. section 360bbb-3(b)(1), unless the authorization is terminated or revoked.     Resp Syncytial Virus by PCR NEGATIVE NEGATIVE Final    Comment: (NOTE) Fact Sheet for Patients: BloggerCourse.com  Fact Sheet for Healthcare Providers: SeriousBroker.it  This test is not yet approved or cleared by the [place] FDA and has been authorized for detection and/or diagnosis of SARS-CoV-2 by FDA under an Emergency Use Authorization (EUA). This EUA will remain in effect (meaning this test can be used) for the duration of the COVID-19 declaration under Section 564(b)(1) of the Act, 21 U.S.C. section 360bbb-3(b)(1), unless the authorization is terminated or revoked.  Performed at [facility], [address], [place], [place] [address]   Culture, blood (routine x 2) Call MD if unable to obtain prior to antibiotics being given     Status: None (Preliminary result)   Collection Time: 02/08/23  5:48 PM   Specimen: BLOOD  Result Value Ref Range Status   Specimen Description BLOOD BLOOD LEFT ARM  Final   Special Requests   Final    BOTTLES DRAWN AEROBIC AND ANAEROBIC Blood Culture results may not be optimal due to an excessive volume of blood received in culture bottles   Culture   Final    NO GROWTH 4 DAYS Performed at [facility], [address], [place], [place] [address]    Report Status PENDING  Incomplete   Culture, blood (routine x 2) Call MD if unable to obtain prior to antibiotics being given     Status: None (Preliminary result)   Collection Time: 02/08/23  5:50 PM   Specimen: BLOOD  Result Value Ref Range Status   Specimen Description BLOOD BLOOD RIGHT HAND  Final   Special Requests   Final    BOTTLES DRAWN AEROBIC AND ANAEROBIC Blood Culture results may not be optimal due to an excessive volume of blood received in culture bottles   Culture   Final    NO GROWTH 4 DAYS Performed at [facility], [address] [place], [place] [address]    Report Status PENDING  Incomplete  Expectorated Sputum Assessment w Gram Stain, Rflx to Resp Cult     Status: None   Collection Time: 02/09/23  8:24 PM   Specimen: Sputum  Result Value Ref Range Status   Specimen Description SPUTUM  Final   Special Requests NONE  Final   Sputum evaluation   Final    THIS SPECIMEN IS ACCEPTABLE FOR SPUTUM CULTURE Performed at [facility], [address] [place], [place] [address]    Report Status 02/09/2023 FINAL  Final  Culture, Respiratory w Gram Stain     Status: None (Preliminary result)   Collection Time: 02/09/23  8:24 PM   Specimen: SPU  Result Value Ref Range Status   Specimen Description SPUTUM  Final   Special Requests NONE Reflexed from [id number]  Final   Gram Stain   Final    FEW WBC PRESENT, PREDOMINANTLY PMN FEW GRAM POSITIVE COCCI FEW GRAM NEGATIVE RODS    Culture   Final    CULTURE REINCUBATED FOR BETTER GROWTH Performed at [facility], [address], [place], [place] [address]    Report Status PENDING  Incomplete     Radiology Studies: ECHOCARDIOGRAM COMPLETE  Result Date: 02/11/2023    ECHOCARDIOGRAM REPORT   Patient Name:   Melinda Stevens Date of Exam: 02/11/2023 Medical Rec #:  [id number]      Height:       65.0 in Accession #:    [id number]     Weight:       217.8 lb Date of Birth:  1956/02/18      BSA:          2.051 m Patient Age:    67 years       BP:            142/83 mmHg Patient Gender: F              HR:           100 bpm. Exam Location:  Inpatient Procedure: 2D Echo, Cardiac Doppler and Color Doppler Indications:    HTN  History:        Patient has no prior history of Echocardiogram examinations.                 Arrythmias:Tachycardia; Risk Factors:Hypertension.  Sonographer:    Karma Ganja Referring Phys: [id number] BINAYA DAHAL IMPRESSIONS  1. Left ventricular ejection fraction, by estimation, is 70 to 75%. The left ventricle has hyperdynamic function. The left ventricle has no regional wall motion abnormalities. There is moderate asymmetric left ventricular hypertrophy of the septal segment. Left ventricular diastolic parameters are consistent with Grade I diastolic dysfunction (impaired relaxation).  2. Right ventricular systolic function is hyperdynamic. The right ventricular size is normal.  3. The mitral valve was not well visualized. No evidence of mitral valve regurgitation.  4. The aortic valve was not well visualized. Aortic valve regurgitation is not visualized. No aortic stenosis is present. Comparison(s): No prior Echocardiogram. FINDINGS  Left Ventricle: Left ventricular ejection fraction, by estimation, is 70 to 75%. The left ventricle has hyperdynamic function.  The left ventricle has no regional wall motion abnormalities. The left ventricular internal cavity size was normal in size. There is moderate asymmetric left ventricular hypertrophy of the septal segment. Left ventricular diastolic parameters are consistent with Grade I diastolic dysfunction (impaired relaxation). Right Ventricle: The right ventricular size is normal. No increase in right ventricular wall thickness. Right ventricular systolic function is hyperdynamic. Left Atrium: Left atrial size was normal in size. Right Atrium: Right atrial size was normal in size. Pericardium: There is no evidence of pericardial effusion. Mitral Valve: The mitral valve was not well visualized. No evidence of  mitral valve regurgitation. Tricuspid Valve: The tricuspid valve is normal in structure. Tricuspid valve regurgitation is not demonstrated. No evidence of tricuspid stenosis. Aortic Valve: The aortic valve was not well visualized. Aortic valve regurgitation is not visualized. No aortic stenosis is present. Aortic valve mean gradient measures 6.0 mmHg. Aortic valve peak gradient measures 10.5 mmHg. Aortic valve area, by VTI measures 2.84 cm. Pulmonic Valve: The pulmonic valve was not well visualized. Pulmonic valve regurgitation is not visualized. No evidence of pulmonic stenosis. Aorta: The aortic root and ascending aorta are structurally normal, with no evidence of dilitation. IAS/Shunts: The interatrial septum was not well visualized.  LEFT VENTRICLE PLAX 2D LVIDd:         4.60 cm   Diastology LVIDs:         3.00 cm   LV e' medial:    8.27 cm/s LV PW:         1.10 cm   LV E/e' medial:  6.6 LV IVS:        1.30 cm   LV e' lateral:   7.94 cm/s LVOT diam:     2.00 cm   LV E/e' lateral: 6.9 LV SV:         68 LV SV Index:   33 LVOT Area:     3.14 cm  RIGHT VENTRICLE RV Basal diam:  4.00 cm RV S prime:     22.60 cm/s TAPSE (M-mode): 2.5 cm LEFT ATRIUM             Index        RIGHT ATRIUM           Index LA diam:        4.00 cm 1.95 cm/m   RA Area:     19.50 cm LA Vol (A2C):   39.5 ml 19.26 ml/m  RA Volume:   59.50 ml  29.01 ml/m LA Vol (A4C):   50.3 ml 24.52 ml/m LA Biplane Vol: 44.5 ml 21.69 ml/m  AORTIC VALVE AV Area (Vmax):    2.66 cm AV Area (Vmean):   2.60 cm AV Area (VTI):     2.84 cm AV Vmax:           162.00 cm/s AV Vmean:          109.000 cm/s AV VTI:            0.238 m AV Peak Grad:      10.5 mmHg AV Mean Grad:      6.0 mmHg LVOT Vmax:         137.00 cm/s LVOT Vmean:        90.200 cm/s LVOT VTI:          0.215 m LVOT/AV VTI ratio: 0.90  AORTA Ao Root diam: 2.90 cm Ao Asc diam:  2.80 cm MITRAL VALVE MV Area (PHT): 4.86 cm    SHUNTS MV Decel Time:  156 msec    Systemic VTI:  0.22 m MV E velocity:  54.40 cm/s  Systemic Diam: 2.00 cm MV A velocity: 85.30 cm/s MV E/A ratio:  0.64 Riley Lam MD Electronically signed by Riley Lam MD Signature Date/Time: 02/11/2023/3:35:03 PM    Final    CT CHEST WO CONTRAST  Result Date: 02/10/2023 CLINICAL DATA:  Chronic dyspnea EXAM: CT CHEST WITHOUT CONTRAST TECHNIQUE: Multidetector CT imaging of the chest was performed following the standard protocol without IV contrast. RADIATION DOSE REDUCTION: This exam was performed according to the departmental dose-optimization program which includes automated exposure control, adjustment of the mA and/or kV according to patient size and/or use of iterative reconstruction technique. COMPARISON:  Chest x-ray 02/08/2023 FINDINGS: Cardiovascular: Limited evaluation without intravenous contrast. Nonaneurysmal aorta. Normal cardiac size. No pericardial effusion Mediastinum/Nodes: Midline trachea. No thyroid mass. Borderline mediastinal lymph nodes. Esophagus within normal limits. Lungs/Pleura: No pleural effusion or pneumothorax. Ground-glass density within the left lower lobe with slight peribronchovascular distribution. Additional foci of mild ground-glass density in the right middle lobe, right lower lobe and left upper lobe. Upper Abdomen: No acute finding small adrenal nodules consistent with adenoma, no change, no imaging follow-up recommended Musculoskeletal: No acute osseous abnormality IMPRESSION: 1. Ground-glass density within the left lower lobe with additional foci of mild ground-glass density in the right middle lobe, right lower lobe and left upper lobe. Findings could be secondary to multifocal infectious pneumonia but could also consider organizing pneumonia or other inflammatory lung disease. Imaging follow-up to resolution is also recommended to exclude neoplasm. 2. Borderline mediastinal lymph nodes, likely reactive. Electronically Signed   By: Jasmine Pang M.D.   On: 02/10/2023 23:23    Scheduled  Meds:  dextromethorphan-guaiFENesin  2 tablet Oral BID   enoxaparin (LOVENOX) injection  40 mg Subcutaneous Q24H   lisinopril  40 mg Oral Daily   And   hydrochlorothiazide  25 mg Oral Daily   [START ON 02/13/2023] predniSONE  40 mg Oral Q breakfast   rosuvastatin  20 mg Oral QPM   Continuous Infusions:   LOS: 3 days   Time spent: 35 minutes  Carollee Herter, DO  [facility]  02/12/2023, 1:25 PM

## 2023-02-12 NOTE — Assessment & Plan Note (Signed)
02-12-2023 BMI 36.06

## 2023-02-12 NOTE — TOC Transition Note (Signed)
Transition of Care [facility]) - CM/SW Discharge Note   Patient Details  Name: Melinda Stevens MRN: [id number] Date of Birth: 07-14-1955  Transition of Care [facility]) CM/SW Contact:  Janae Bridgeman, RN Phone Number: 02/12/2023, 1:47 PM   Clinical Narrative:    CM met with the patient at the bedside to discuss TOC needs to return home.  The patient is now on RA and no longer needs home oxygen,  I removed home oxygen order and MD to co-sign the discontinuance of the home oxygen.  Patient needs home nebulizer machine  MD ordered the machine and DME note was placed to be co-signed by the MD.  Patient states that she needs a new albuterol inhaler called in as well.  MD aware and will provide.  Patient will discharge home today once her machine arrives to the room.  Patient will call her PCP and follow up in the next 7-10 days.         Patient Goals and CMS Choice      Discharge Placement                         Discharge Plan and Services Additional resources added to the After Visit Summary for                                       Social Determinants of Health (SDOH) Interventions SDOH Screenings   Food Insecurity: No Food Insecurity (02/08/2023)  Housing: Low Risk  (02/08/2023)  Transportation Needs: No Transportation Needs (02/08/2023)  Utilities: Not At Risk (02/08/2023)  Tobacco Use: Low Risk  (02/08/2023)     Readmission Risk Interventions    02/11/2023   12:09 PM  Readmission Risk Prevention Plan  Post Dischage Appt Complete  Medication Screening Complete  Transportation Screening Complete

## 2023-02-12 NOTE — Subjective & Objective (Signed)
Pt seen and examined. She states she feels so much better. No wheezing. No fevers. Ready to go home.

## 2023-02-12 NOTE — Progress Notes (Signed)
    Durable Medical Equipment  (From admission, onward)           Start     Ordered   02/12/23 1012  For home use only DME Nebulizer machine  Once       Question Answer Comment  Patient needs a nebulizer to treat with the following condition Bronchospasm   Length of Need Lifetime      02/12/23 1012

## 2023-02-12 NOTE — Assessment & Plan Note (Signed)
02-12-2023 was treated with IV steroids and nebulizer treatments. Will arrange for home neb machine and complete 5 days of po prednisone at home. 40 mg every day.

## 2023-02-12 NOTE — Discharge Summary (Addendum)
[facility] Physician Discharge Summary   Patient name: Melinda Stevens  Admit date:     02/08/2023  Discharge date: 02/12/2023  Attending Physician: Lorin Glass [[id number]]  Discharge Physician: Carollee Herter   PCP: Selinda Flavin, MD  Admitted From: Home  Disposition:  Home  Recommendations for Outpatient Follow-up:  Follow up with PCP in 1-2 weeks Home Health:No Equipment/Devices: Home Nebulizer machine  Discharge Condition:Stable CODE STATUS:FULL Diet recommendation: Heart Healthy Fluid Restriction: None  Hospital Summary: HPI: Melinda Stevens is a 67 y.o. female with medical history significant of HTN and bronchitis presenting with complaints of cough, congestion, and dyspnea.  She states that she began having a cough about 2-1/2 weeks ago.  She went to see her primary care doctor and was provided a course of azithromycin along with steroids.  However even after completing course of azithromycin and steroids, her symptoms did not resolve.  She again went to see her primary care doctor and was prescribed Augmentin along with an albuterol inhaler.  She completed her Augmentin therapy without any notable improvement in her symptoms.  She reports persistence of her cough, congestion, dyspnea.  States that her cough is productive of greenish colored sputum.  She also states that her dyspnea is present at rest along with exertion.  She denies any fevers, chills, nausea, vomiting, chest pain, palpitations, diarrhea, or any urinary symptoms.   ED course: CBC with leukocytosis (16.2).  BMP with mild hypokalemia and mild hyperglycemia, otherwise stable kidney function.  Chest x-ray does show likely left lower lobe infiltrate.  She was started on Levaquin per ED provider given failure of azithromycin and Augmentin in the outpatient setting.  [facility] asked to evaluate patient for admission.  Significant Events: Admitted 02/08/2023 for pneumonia   Significant Labs: Admission WBC  16.2, K 3.1  Significant Imaging Studies: Admission CT chest Ground-glass density within the left lower lobe with additional foci of mild ground-glass density in the right middle lobe, right lower lobe and left upper lobe. Findings could be secondary to multifocal infectious pneumonia but could also consider organizing pneumonia or other inflammatory lung disease. Imaging follow-up to resolution is also recommended to exclude neoplasm. 2. Borderline mediastinal lymph nodes, likely reactive.  Antibiotic Therapy: Anti-infectives (From admission, onward)    Start     Dose/Rate Route Frequency Ordered Stop   02/11/23 1000  levofloxacin (LEVAQUIN) tablet 750 mg        750 mg Oral Daily 02/10/23 1333 02/13/23 0959   02/09/23 1500  levofloxacin (LEVAQUIN) IVPB 750 mg  Status:  Discontinued        750 mg 100 mL/hr over 90 Minutes Intravenous Every 24 hours 02/08/23 1659 02/10/23 1333   02/08/23 1530  levofloxacin (LEVAQUIN) IVPB 750 mg        750 mg 100 mL/hr over 90 Minutes Intravenous  Once 02/08/23 1524 02/08/23 1712       Procedures:   Consultants: [facility] Course by Problem: * CAP (community acquired pneumonia) Since admission until 02-11-2023 Presented with shortness of breath, cough for 2 weeks and did not respond to 2 separate courses of azithromycin and Augmentin.   Chest x-ray with left lower lobe infiltrate Currently on Levaquin 750 mg daily WBC count mildly elevated.  Procalcitonin level not elevated. CT chest 12/2 showed groundglass density within the left lower lobe as well as right middle lobe right lower lobe and left upper lobe. On auscultation, patient continues to have diffuse bronchial breath sounds and coughs  on deep breathing.  Pulmonary consulted  Recommended to continue Levaquin.  Also ordered for IV Solu-Medrol 40 mg every 8 hours and DuoNeb every 6 hours. Continue Mucinex DM twice daily for cough Negative respiratory virus panel Continue incentive  spirometry May need home oxygen at discharge 02-12-2023 thanks for pulmonary consult. Pt feels much better and requesting DC to home. Pt has been weaned off O2. Per RN, pt was told by PCCM that she can go home. Will arrange for home nebulizer machine.  Acute bronchospasm 02-12-2023 was treated with IV steroids and nebulizer treatments. Will arrange for home neb machine and complete 5 days of po prednisone at home. 40 mg every day.  Acute respiratory failure with hypoxia (HCC) Since admission until 02-11-2023. Pt was requiring supplemental O2. As high as 2 L/min.  02-12-2023 pt weaned to RA. Will not need home O2.  Essential hypertension 02-12-2023 stable on home meds.  Obesity (BMI 30-39.9) 02-12-2023 BMI 36.06    Discharge Diagnoses:  Principal Problem:   CAP (community acquired pneumonia) Active Problems:   Acute respiratory failure with hypoxia (HCC)   Acute bronchospasm   Obesity (BMI 30-39.9)   Essential hypertension   Discharge Instructions  Discharge Instructions     Call MD for:  difficulty breathing, headache or visual disturbances   Complete by: As directed    Call MD for:  extreme fatigue   Complete by: As directed    Call MD for:  persistant dizziness or light-headedness   Complete by: As directed    Call MD for:  persistant nausea and vomiting   Complete by: As directed    Call MD for:  severe uncontrolled pain   Complete by: As directed    Call MD for:  temperature >100.4   Complete by: As directed    Diet - low sodium heart healthy   Complete by: As directed    Discharge instructions   Complete by: As directed    1. Follow up with your primary care provider in 1-2 weeks following discharge from hospital   Increase activity slowly   Complete by: As directed       Allergies as of 02/12/2023   No Known Allergies      Medication List     TAKE these medications    acetaminophen 500 MG tablet Commonly known as: TYLENOL Take 500 mg by mouth every 6  (six) hours as needed for mild pain or moderate pain.   albuterol 108 (90 Base) MCG/ACT inhaler Commonly known as: VENTOLIN HFA Inhale 1-2 puffs into the lungs every 4 (four) hours as needed for wheezing or shortness of breath.   amLODipine 2.5 MG tablet Commonly known as: NORVASC Take 2.5 mg by mouth daily.   clobetasol 0.05 % external solution Commonly known as: TEMOVATE Apply 1 Application topically daily.   finasteride 5 MG tablet Commonly known as: PROSCAR Take 2.5 mg by mouth daily.   ICAPS Tabs Take 1 tablet by mouth daily.   ipratropium-albuterol 0.5-2.5 (3) MG/3ML Soln Commonly known as: DUONEB Take 3 mLs by nebulization every 4 (four) hours as needed.   levofloxacin 750 MG tablet Commonly known as: Levaquin Take 1 tablet (750 mg total) by mouth daily for 5 days.   lisinopril-hydrochlorothiazide 20-12.5 MG tablet Commonly known as: ZESTORETIC Take 2 tablets by mouth daily.   multivitamin with minerals Tabs tablet Take 1 tablet by mouth daily.   Ozempic (1 MG/DOSE) 4 MG/3ML Sopn Generic drug: Semaglutide (1 MG/DOSE) Inject 1 mg into  the skin once a week. Wednesdays   predniSONE 20 MG tablet Commonly known as: DELTASONE Take 2 tablets (40 mg total) by mouth daily with breakfast for 5 days. Start taking on: February 13, 2023   rosuvastatin 20 MG tablet Commonly known as: CRESTOR Take 20 mg by mouth every evening.               Durable Medical Equipment  (From admission, onward)           Start     Ordered   02/12/23 1012  For home use only DME Nebulizer machine  Once       Question Answer Comment  Patient needs a nebulizer to treat with the following condition Bronchospasm   Length of Need Lifetime      02/12/23 1012            Follow-up Information     [employer] Follow up.   Why: [employer] company will provide home oxygen to your hospital room before you are discharged home. Contact information: [address] [place] [address] [phone]         Selinda Flavin, MD. Schedule an appointment as soon as possible for a visit.   Specialty: Family Medicine Why: Please call the office and schedule a hospital follow up in the next 7-10 days. Contact information: [address] [place] [address] [phone]                No Known Allergies  Discharge Exam: Vitals:   02/12/23 0733 02/12/23 0751  BP:  (!) 130/40  Pulse:  (!) 101  Resp:  18  Temp:  99.1 F (37.3 C)  SpO2: 96% 96%    Physical Exam Vitals and nursing note reviewed.  Constitutional:      General: She is not in acute distress.    Appearance: She is obese. She is not toxic-appearing or diaphoretic.  HENT:     Head: Normocephalic and atraumatic.     Nose: Nose normal.  Eyes:     General: No scleral icterus. Cardiovascular:     Rate and Rhythm: Normal rate and regular rhythm.  Pulmonary:     Effort: Pulmonary effort is normal.     Breath sounds: Normal breath sounds.     Comments: Small squeak with inspiration only in left upper lobe. Otherwise clear and no wheezing Abdominal:     General: Bowel sounds are normal. There is no distension.     Palpations: Abdomen is soft.  Musculoskeletal:     Right lower leg: No edema.     Left lower leg: No edema.  Skin:    General: Skin is warm and dry.     Capillary Refill: Capillary refill takes less than 2 seconds.  Neurological:     General: No focal deficit present.     Mental Status: She is alert and oriented to person, place, and time.     The results of significant diagnostics from this hospitalization (including imaging, microbiology, ancillary and laboratory) are listed below for reference.    Microbiology: Recent Results (from the past 240 hour(s))  Resp panel by RT-PCR (RSV, Flu A&B, Covid) Anterior Nasal Swab     Status: None   Collection Time: 02/08/23  1:03 PM   Specimen: Anterior Nasal Swab  Result Value Ref Range Status   SARS  Coronavirus 2 by RT PCR NEGATIVE NEGATIVE Final   Influenza A by PCR NEGATIVE NEGATIVE Final   Influenza B  by PCR NEGATIVE NEGATIVE Final    Comment: (NOTE) The Xpert Xpress SARS-CoV-2/FLU/RSV plus assay is intended as an aid in the diagnosis of influenza from Nasopharyngeal swab specimens and should not be used as a sole basis for treatment. Nasal washings and aspirates are unacceptable for Xpert Xpress SARS-CoV-2/FLU/RSV testing.  Fact Sheet for Patients: BloggerCourse.com  Fact Sheet for Healthcare Providers: SeriousBroker.it  This test is not yet approved or cleared by the [place] FDA and has been authorized for detection and/or diagnosis of SARS-CoV-2 by FDA under an Emergency Use Authorization (EUA). This EUA will remain in effect (meaning this test can be used) for the duration of the COVID-19 declaration under Section 564(b)(1) of the Act, 21 U.S.C. section 360bbb-3(b)(1), unless the authorization is terminated or revoked.     Resp Syncytial Virus by PCR NEGATIVE NEGATIVE Final    Comment: (NOTE) Fact Sheet for Patients: BloggerCourse.com  Fact Sheet for Healthcare Providers: SeriousBroker.it  This test is not yet approved or cleared by the [place] FDA and has been authorized for detection and/or diagnosis of SARS-CoV-2 by FDA under an Emergency Use Authorization (EUA). This EUA will remain in effect (meaning this test can be used) for the duration of the COVID-19 declaration under Section 564(b)(1) of the Act, 21 U.S.C. section 360bbb-3(b)(1), unless the authorization is terminated or revoked.  Performed at [facility], [address], [place], [place] [address]   Culture, blood (routine x 2) Call MD if unable to obtain prior to antibiotics being given     Status: None (Preliminary result)   Collection Time: 02/08/23  5:48 PM   Specimen: BLOOD   Result Value Ref Range Status   Specimen Description BLOOD BLOOD LEFT ARM  Final   Special Requests   Final    BOTTLES DRAWN AEROBIC AND ANAEROBIC Blood Culture results may not be optimal due to an excessive volume of blood received in culture bottles   Culture   Final    NO GROWTH 4 DAYS Performed at [facility], [address] [place], [place] [address]    Report Status PENDING  Incomplete  Culture, blood (routine x 2) Call MD if unable to obtain prior to antibiotics being given     Status: None (Preliminary result)   Collection Time: 02/08/23  5:50 PM   Specimen: BLOOD  Result Value Ref Range Status   Specimen Description BLOOD BLOOD RIGHT HAND  Final   Special Requests   Final    BOTTLES DRAWN AEROBIC AND ANAEROBIC Blood Culture results may not be optimal due to an excessive volume of blood received in culture bottles   Culture   Final    NO GROWTH 4 DAYS Performed at [facility], [address] [place], [place] [address]    Report Status PENDING  Incomplete  Expectorated Sputum Assessment w Gram Stain, Rflx to Resp Cult     Status: None   Collection Time: 02/09/23  8:24 PM   Specimen: Sputum  Result Value Ref Range Status   Specimen Description SPUTUM  Final   Special Requests NONE  Final   Sputum evaluation   Final    THIS SPECIMEN IS ACCEPTABLE FOR SPUTUM CULTURE Performed at [facility], [address], [place], [place] [address]    Report Status 02/09/2023 FINAL  Final  Culture, Respiratory w Gram Stain     Status: None (Preliminary result)   Collection Time: 02/09/23  8:24 PM   Specimen: SPU  Result Value Ref Range Status   Specimen Description SPUTUM  Final   Special Requests NONE Reflexed from [id number]  Final   Gram Stain   Final    FEW WBC PRESENT, PREDOMINANTLY PMN FEW GRAM POSITIVE COCCI FEW GRAM NEGATIVE RODS    Culture   Final    CULTURE REINCUBATED FOR BETTER GROWTH Performed at [facility], [address] [place],  [place] [address]    Report Status PENDING  Incomplete     Labs: Basic Metabolic Panel: Recent Labs  Lab 02/08/23 1320 02/08/23 1327 02/09/23 0543 02/10/23 0721  NA 141 141 139 141  K 3.1* 3.1* 3.7 3.8  CL 102  --  104 101  CO2 30  --  29 30  GLUCOSE 135*  --  110* 89  BUN 8  --  11 14  CREATININE 0.98  --  1.01* 0.93  CALCIUM 9.4  --  8.6* 8.7*   CBC: Recent Labs  Lab 02/08/23 1320 02/08/23 1327 02/09/23 0543 02/10/23 0721  WBC 16.2*  --  13.8* 12.8*  NEUTROABS  --   --   --  8.9*  HGB 13.7 14.3 12.2 13.0  HCT 41.2 42.0 37.6 41.1  MCV 90.2  --  90.2 91.3  PLT 197  --  189 219   CBG: Recent Labs  Lab 02/09/23 0602 02/09/23 0804 02/10/23 0749 02/11/23 0616 02/12/23 0604  GLUCAP 112* 150* 83 101* 138*   Sepsis Labs Recent Labs  Lab 02/08/23 1320 02/09/23 0543 02/10/23 0721  WBC 16.2* 13.8* 12.8*   Microbiology Recent Results (from the past 240 hour(s))  Resp panel by RT-PCR (RSV, Flu A&B, Covid) Anterior Nasal Swab     Status: None   Collection Time: 02/08/23  1:03 PM   Specimen: Anterior Nasal Swab  Result Value Ref Range Status   SARS Coronavirus 2 by RT PCR NEGATIVE NEGATIVE Final   Influenza A by PCR NEGATIVE NEGATIVE Final   Influenza B by PCR NEGATIVE NEGATIVE Final    Comment: (NOTE) The Xpert Xpress SARS-CoV-2/FLU/RSV plus assay is intended as an aid in the diagnosis of influenza from Nasopharyngeal swab specimens and should not be used as a sole basis for treatment. Nasal washings and aspirates are unacceptable for Xpert Xpress SARS-CoV-2/FLU/RSV testing.  Fact Sheet for Patients: BloggerCourse.com  Fact Sheet for Healthcare Providers: SeriousBroker.it  This test is not yet approved or cleared by the [place] FDA and has been authorized for detection and/or diagnosis of SARS-CoV-2 by FDA under an Emergency Use Authorization (EUA). This EUA will remain in effect (meaning this test can be  used) for the duration of the COVID-19 declaration under Section 564(b)(1) of the Act, 21 U.S.C. section 360bbb-3(b)(1), unless the authorization is terminated or revoked.     Resp Syncytial Virus by PCR NEGATIVE NEGATIVE Final    Comment: (NOTE) Fact Sheet for Patients: BloggerCourse.com  Fact Sheet for Healthcare Providers: SeriousBroker.it  This test is not yet approved or cleared by the [place] FDA and has been authorized for detection and/or diagnosis of SARS-CoV-2 by FDA under an Emergency Use Authorization (EUA). This EUA will remain in effect (meaning this test can be used) for the duration of the COVID-19 declaration under Section 564(b)(1) of the Act, 21 U.S.C. section 360bbb-3(b)(1), unless the authorization is terminated or revoked.  Performed at [facility], [address] [place], [place] [address]   Culture, blood (routine x 2) Call MD if unable to obtain prior to  antibiotics being given     Status: None (Preliminary result)   Collection Time: 02/08/23  5:48 PM   Specimen: BLOOD  Result Value Ref Range Status   Specimen Description BLOOD BLOOD LEFT ARM  Final   Special Requests   Final    BOTTLES DRAWN AEROBIC AND ANAEROBIC Blood Culture results may not be optimal due to an excessive volume of blood received in culture bottles   Culture   Final    NO GROWTH 4 DAYS Performed at [facility], [address] [place], [place] [address]    Report Status PENDING  Incomplete  Culture, blood (routine x 2) Call MD if unable to obtain prior to antibiotics being given     Status: None (Preliminary result)   Collection Time: 02/08/23  5:50 PM   Specimen: BLOOD  Result Value Ref Range Status   Specimen Description BLOOD BLOOD RIGHT HAND  Final   Special Requests   Final    BOTTLES DRAWN AEROBIC AND ANAEROBIC Blood Culture results may not be optimal due to an excessive volume of blood received in culture  bottles   Culture   Final    NO GROWTH 4 DAYS Performed at [facility], [address] [place], [place] [address]    Report Status PENDING  Incomplete  Expectorated Sputum Assessment w Gram Stain, Rflx to Resp Cult     Status: None   Collection Time: 02/09/23  8:24 PM   Specimen: Sputum  Result Value Ref Range Status   Specimen Description SPUTUM  Final   Special Requests NONE  Final   Sputum evaluation   Final    THIS SPECIMEN IS ACCEPTABLE FOR SPUTUM CULTURE Performed at [facility], [address] [place], [place] [address]    Report Status 02/09/2023 FINAL  Final  Culture, Respiratory w Gram Stain     Status: None (Preliminary result)   Collection Time: 02/09/23  8:24 PM   Specimen: SPU  Result Value Ref Range Status   Specimen Description SPUTUM  Final   Special Requests NONE Reflexed from [id number]  Final   Gram Stain   Final    FEW WBC PRESENT, PREDOMINANTLY PMN FEW GRAM POSITIVE COCCI FEW GRAM NEGATIVE RODS    Culture   Final    CULTURE REINCUBATED FOR BETTER GROWTH Performed at [facility], [address] [place], [place] [address]    Report Status PENDING  Incomplete    Procedures/Studies: ECHOCARDIOGRAM COMPLETE  Result Date: 02/11/2023    ECHOCARDIOGRAM REPORT   Patient Name:   Melinda Stevens Date of Exam: 02/11/2023 Medical Rec #:  [id number]      Height:       65.0 in Accession #:    [id number]     Weight:       217.8 lb Date of Birth:  Jan 22, 1956      BSA:          2.051 m Patient Age:    67 years       BP:           142/83 mmHg Patient Gender: F              HR:           100 bpm. Exam Location:  Inpatient Procedure: 2D Echo, Cardiac Doppler and Color Doppler Indications:    HTN  History:        Patient has no prior history of Echocardiogram examinations.  Arrythmias:Tachycardia; Risk Factors:Hypertension.  Sonographer:    Karma Ganja Referring Phys: [id number] BINAYA DAHAL IMPRESSIONS  1. Left ventricular ejection fraction, by  estimation, is 70 to 75%. The left ventricle has hyperdynamic function. The left ventricle has no regional wall motion abnormalities. There is moderate asymmetric left ventricular hypertrophy of the septal segment. Left ventricular diastolic parameters are consistent with Grade I diastolic dysfunction (impaired relaxation).  2. Right ventricular systolic function is hyperdynamic. The right ventricular size is normal.  3. The mitral valve was not well visualized. No evidence of mitral valve regurgitation.  4. The aortic valve was not well visualized. Aortic valve regurgitation is not visualized. No aortic stenosis is present. Comparison(s): No prior Echocardiogram. FINDINGS  Left Ventricle: Left ventricular ejection fraction, by estimation, is 70 to 75%. The left ventricle has hyperdynamic function. The left ventricle has no regional wall motion abnormalities. The left ventricular internal cavity size was normal in size. There is moderate asymmetric left ventricular hypertrophy of the septal segment. Left ventricular diastolic parameters are consistent with Grade I diastolic dysfunction (impaired relaxation). Right Ventricle: The right ventricular size is normal. No increase in right ventricular wall thickness. Right ventricular systolic function is hyperdynamic. Left Atrium: Left atrial size was normal in size. Right Atrium: Right atrial size was normal in size. Pericardium: There is no evidence of pericardial effusion. Mitral Valve: The mitral valve was not well visualized. No evidence of mitral valve regurgitation. Tricuspid Valve: The tricuspid valve is normal in structure. Tricuspid valve regurgitation is not demonstrated. No evidence of tricuspid stenosis. Aortic Valve: The aortic valve was not well visualized. Aortic valve regurgitation is not visualized. No aortic stenosis is present. Aortic valve mean gradient measures 6.0 mmHg. Aortic valve peak gradient measures 10.5 mmHg. Aortic valve area, by VTI  measures 2.84 cm. Pulmonic Valve: The pulmonic valve was not well visualized. Pulmonic valve regurgitation is not visualized. No evidence of pulmonic stenosis. Aorta: The aortic root and ascending aorta are structurally normal, with no evidence of dilitation. IAS/Shunts: The interatrial septum was not well visualized.  LEFT VENTRICLE PLAX 2D LVIDd:         4.60 cm   Diastology LVIDs:         3.00 cm   LV e' medial:    8.27 cm/s LV PW:         1.10 cm   LV E/e' medial:  6.6 LV IVS:        1.30 cm   LV e' lateral:   7.94 cm/s LVOT diam:     2.00 cm   LV E/e' lateral: 6.9 LV SV:         68 LV SV Index:   33 LVOT Area:     3.14 cm  RIGHT VENTRICLE RV Basal diam:  4.00 cm RV S prime:     22.60 cm/s TAPSE (M-mode): 2.5 cm LEFT ATRIUM             Index        RIGHT ATRIUM           Index LA diam:        4.00 cm 1.95 cm/m   RA Area:     19.50 cm LA Vol (A2C):   39.5 ml 19.26 ml/m  RA Volume:   59.50 ml  29.01 ml/m LA Vol (A4C):   50.3 ml 24.52 ml/m LA Biplane Vol: 44.5 ml 21.69 ml/m  AORTIC VALVE AV Area (Vmax):    2.66 cm AV Area (Vmean):  2.60 cm AV Area (VTI):     2.84 cm AV Vmax:           162.00 cm/s AV Vmean:          109.000 cm/s AV VTI:            0.238 m AV Peak Grad:      10.5 mmHg AV Mean Grad:      6.0 mmHg LVOT Vmax:         137.00 cm/s LVOT Vmean:        90.200 cm/s LVOT VTI:          0.215 m LVOT/AV VTI ratio: 0.90  AORTA Ao Root diam: 2.90 cm Ao Asc diam:  2.80 cm MITRAL VALVE MV Area (PHT): 4.86 cm    SHUNTS MV Decel Time: 156 msec    Systemic VTI:  0.22 m MV E velocity: 54.40 cm/s  Systemic Diam: 2.00 cm MV A velocity: 85.30 cm/s MV E/A ratio:  0.64 Riley Lam MD Electronically signed by Riley Lam MD Signature Date/Time: 02/11/2023/3:35:03 PM    Final    CT CHEST WO CONTRAST  Result Date: 02/10/2023 CLINICAL DATA:  Chronic dyspnea EXAM: CT CHEST WITHOUT CONTRAST TECHNIQUE: Multidetector CT imaging of the chest was performed following the standard protocol without IV  contrast. RADIATION DOSE REDUCTION: This exam was performed according to the departmental dose-optimization program which includes automated exposure control, adjustment of the mA and/or kV according to patient size and/or use of iterative reconstruction technique. COMPARISON:  Chest x-ray 02/08/2023 FINDINGS: Cardiovascular: Limited evaluation without intravenous contrast. Nonaneurysmal aorta. Normal cardiac size. No pericardial effusion Mediastinum/Nodes: Midline trachea. No thyroid mass. Borderline mediastinal lymph nodes. Esophagus within normal limits. Lungs/Pleura: No pleural effusion or pneumothorax. Ground-glass density within the left lower lobe with slight peribronchovascular distribution. Additional foci of mild ground-glass density in the right middle lobe, right lower lobe and left upper lobe. Upper Abdomen: No acute finding small adrenal nodules consistent with adenoma, no change, no imaging follow-up recommended Musculoskeletal: No acute osseous abnormality IMPRESSION: 1. Ground-glass density within the left lower lobe with additional foci of mild ground-glass density in the right middle lobe, right lower lobe and left upper lobe. Findings could be secondary to multifocal infectious pneumonia but could also consider organizing pneumonia or other inflammatory lung disease. Imaging follow-up to resolution is also recommended to exclude neoplasm. 2. Borderline mediastinal lymph nodes, likely reactive. Electronically Signed   By: Jasmine Pang M.D.   On: 02/10/2023 23:23   DG Chest 2 View  Result Date: 02/08/2023 CLINICAL DATA:  Shortness of breath EXAM: CHEST - 2 VIEW COMPARISON:  05/02/2016 FINDINGS: The heart size and mediastinal contours are within normal limits. Mild streaky left basilar opacity. Right lung is clear. No pleural effusion or pneumothorax. The visualized skeletal structures are unremarkable. IMPRESSION: Mild streaky left basilar opacity, favor atelectasis. Electronically Signed    By: Duanne Guess D.O.   On: 02/08/2023 12:43    Time coordinating discharge: 45 mins  SIGNED:  Carollee Herter, DO [facility] 02/12/23, 1:39 PM

## 2023-02-12 NOTE — Progress Notes (Signed)
Mobility Specialist Progress Note:   02/12/23 1100  Mobility  Activity Ambulated independently in hallway  Level of Assistance Independent  Assistive Device None  Distance Ambulated (ft) 420 ft  Activity Response Tolerated well  Mobility Referral Yes  $Mobility charge 1 Mobility  Mobility Specialist Start Time (ACUTE ONLY) 1100  Mobility Specialist Stop Time (ACUTE ONLY) 1111  Mobility Specialist Time Calculation (min) (ACUTE ONLY) 11 min   Pt eager for mobility session. Required no physical assistance throughout ambulation. SpO2 90-94% on RA throughout. Pt back sitting EOB with all needs met, MD in room.  Addison Lank Mobility Specialist Please contact via SecureChat or  Rehab office at [phone]

## 2023-02-12 NOTE — Telephone Encounter (Signed)
Please schedule hospital follow-up at [facility] clinic with APP -community-acquired pneumonia -2 to 4 weeks

## 2023-02-12 NOTE — Progress Notes (Addendum)
NAME:  Melinda Stevens, MRN:  [id number], DOB:  1955/08/23, LOS: 3 ADMISSION DATE:  02/08/2023, CONSULTATION DATE: 02/11/2023 REFERRING MD: Sedalia Muta, CHIEF COMPLAINT: Persistent hypoxia  History of Present Illness:  67 year old female never smoker who reports having his sinus symptoms on a biannual basis for which she usually gets treated with antibiotics and prednisone.  She was treated with the usual pharmaceutical regimen earlier and failed to respond.  She continued to have postnasal drip, increasing cough along with increasing shortness of breath.  She is admitted to the hospital CT scan showed multiple lobar groundglass opacities consistent with pneumonia.  She had been treated with antibiotics and oxygen to keep her sats greater than 88%.  Despite these interventions she continues to be hypoxic and pulmonary critical care is asked to evaluate.  Pertinent  Medical History   Past Medical History:  Diagnosis Date   Diverticulitis    Hypertension    Renal disorder      Significant Hospital Events: Including procedures, antibiotic start and stop dates in addition to other pertinent events     Interim History / Subjective:  Looks and feels better  Objective   Blood pressure (!) 130/40, pulse (!) 101, temperature 99.1 F (37.3 C), temperature source Oral, resp. rate 18, height 5\' 5"  (1.651 m), weight 98.3 kg, SpO2 96%.        Intake/Output Summary (Last 24 hours) at 02/12/2023 1017 Last data filed at 02/11/2023 1300 Gross per 24 hour  Intake 237 ml  Output --  Net 237 ml   Filed Weights   02/10/23 0441 02/11/23 0550 02/12/23 0349  Weight: 98 kg 98.8 kg 98.3 kg    Examination: Looks and feels much better Oxygen is down to 2 L nasal cannula Creased breath sounds at bases wheezing has decreased Sounds are regular rate and rhythm Abdomen soft nontender positive bowel sounds Extremities without edema  Resolved Hospital Problem list     Assessment & Plan:  67 year old  retired female never smoker who for seasonal allergies twice year that usually require treatment for bronchitis.  She was treated for bronchitis earlier in the year but never improved.  She had brown sputum along with general malaise and shortness of breath.  She had failed outpatient treatment was admitted to the hospital and treated for pneumonia and with a CT scan showing multiple groundglass opacities.  She has improved somewhat but remains O2 dependent and pulmonary critical care is asked to evaluate.  She did receive  prednisone.  Continues to improve on current therapy Steroids Patient is weaning from oxygen  continue bronchodilators She may not need home oxygen   All other issues per primary   Best Practice (right click and "Reselect all SmartList Selections" daily)   Diet/type: Regular consistency (see orders) DVT prophylaxis: enoxaparin (LOVENOX) injection 40 mg Start: 02/08/23 1800   Pressure ulcer(s): not present on admission  GI prophylaxis: PPI Lines: N/A Foley:  N/A Code Status:  full code Last date of multidisciplinary goals of care discussion [tbd]  Labs   CBC: Recent Labs  Lab 02/08/23 1320 02/08/23 1327 02/09/23 0543 02/10/23 0721  WBC 16.2*  --  13.8* 12.8*  NEUTROABS  --   --   --  8.9*  HGB 13.7 14.3 12.2 13.0  HCT 41.2 42.0 37.6 41.1  MCV 90.2  --  90.2 91.3  PLT 197  --  189 219    Basic Metabolic Panel: Recent Labs  Lab 02/08/23 1320 02/08/23 1327 02/09/23 0543 02/10/23 4098  NA 141 141 139 141  K 3.1* 3.1* 3.7 3.8  CL 102  --  104 101  CO2 30  --  29 30  GLUCOSE 135*  --  110* 89  BUN 8  --  11 14  CREATININE 0.98  --  1.01* 0.93  CALCIUM 9.4  --  8.6* 8.7*   GFR: Estimated Creatinine Clearance: 68.1 mL/min (by C-G formula based on SCr of 0.93 mg/dL). Recent Labs  Lab 02/08/23 1320 02/09/23 0543 02/10/23 0721  PROCALCITON  --   --  <0.10  WBC 16.2* 13.8* 12.8*    Liver Function Tests: No results for input(s): "AST", "ALT",  "ALKPHOS", "BILITOT", "PROT", "ALBUMIN" in the last 168 hours. No results for input(s): "LIPASE", "AMYLASE" in the last 168 hours. No results for input(s): "AMMONIA" in the last 168 hours.  ABG    Component Value Date/Time   HCO3 32.0 (H) 02/08/2023 1327   TCO2 33 (H) 02/08/2023 1327   O2SAT 73 02/08/2023 1327     Coagulation Profile: No results for input(s): "INR", "PROTIME" in the last 168 hours.  Cardiac Enzymes: No results for input(s): "CKTOTAL", "CKMB", "CKMBINDEX", "TROPONINI" in the last 168 hours.  HbA1C: No results found for: "HGBA1C"  CBG: Recent Labs  Lab 02/09/23 0602 02/09/23 0804 02/10/23 0749 02/11/23 0616 02/12/23 0604  GLUCAP 112* 150* 83 101* 138*     Critical care time: Elizebeth Brooking Minor ACNP Acute Care Nurse Practitioner [facility]ack Pulmonary/Critical Care Please consult Amion 02/12/2023, 10:17 AM    Slept much better, decreased coughing. Was able to ambulate this morning without significant desaturation. On exam -clear breath sounds bilateral, no accessory muscle use, no rhonchi, S1-S2 regular, no edema.  Sputum culture pending.  Impression/plan Community-acquired pneumonia -bronchospasm has resolved with steroids.  -Continue Levaquin for 5 to 7 days. -Short course of prednisone 40 mg -taper every 3 days to off Hypoxia is resolved.  We will schedule outpatient follow-up  Mikiya Nebergall V. Vassie Loll MD

## 2023-02-12 NOTE — Assessment & Plan Note (Signed)
Since admission until 02-11-2023 Presented with shortness of breath, cough for 2 weeks and did not respond to 2 separate courses of azithromycin and Augmentin.   Chest x-ray with left lower lobe infiltrate Currently on Levaquin 750 mg daily WBC count mildly elevated.  Procalcitonin level not elevated. CT chest 12/2 showed groundglass density within the left lower lobe as well as right middle lobe right lower lobe and left upper lobe. On auscultation, patient continues to have diffuse bronchial breath sounds and coughs on deep breathing.  Pulmonary consulted  Recommended to continue Levaquin.  Also ordered for IV Solu-Medrol 40 mg every 8 hours and DuoNeb every 6 hours. Continue Mucinex DM twice daily for cough Negative respiratory virus panel Continue incentive spirometry May need home oxygen at discharge 02-12-2023 thanks for pulmonary consult. Pt feels much better and requesting DC to home. Pt has been weaned off O2. Per RN, pt was told by PCCM that she can go home. Will arrange for home nebulizer machine.

## 2023-02-13 LAB — CULTURE, BLOOD (ROUTINE X 2)
Culture: NO GROWTH
Culture: NO GROWTH

## 2023-03-06 ENCOUNTER — Ambulatory Visit: Payer: Medicare Other | Admitting: Primary Care

## 2023-03-06 ENCOUNTER — Encounter: Payer: Self-pay | Admitting: Primary Care

## 2023-03-06 VITALS — BP 128/68 | HR 77 | Ht 65.0 in | Wt 223.0 lb

## 2023-03-06 DIAGNOSIS — J189 Pneumonia, unspecified organism: Secondary | ICD-10-CM | POA: Diagnosis not present

## 2023-03-06 NOTE — Progress Notes (Signed)
@Patient  ID: Melinda Stevens, female    DOB: May 15, 1955, 67 y.o.   MRN: [id number]  Chief Complaint  Patient presents with   Hospitalization Follow-up    Recent pna. Doing well overall. Has occ cough with clear sputum. Has not had to use albuterol since hospital d/c.    Referring provider: Selinda Flavin, MD  HPI: 67 year old female, never smoked. PMH significant for HTN, CAP, acute respiratory failure with hypoxia, acute bronchospasm, obesity.   03/06/2023- Interim hx  Discussed the use of AI scribe software for clinical note transcription with the patient, who gave verbal consent to proceed.  History of Present Illness   The patient, with a recent hospitalization in November 2024 for community-acquired pneumonia, presents for a follow-up visit. She initially experienced cough, congestion, and shortness of breath approximately 2.5 weeks prior to hospitalization. Despite treatment with a Z-Pak and steroids, followed by Augmentin and an albuterol inhaler, symptoms persisted, including a cough with green sputum.  Upon presentation to the ED, lab work revealed an elevated white blood cell count of 16, and chest x-rays indicated a likely left lower lobe pneumonia. The patient was started on Levaquin due to the failure of previous antibiotics and was hospitalized for five days.  Since discharge three weeks ago, the patient reports feeling significantly better, with only a residual raspy cough. She used a prescribed nebulizer for three to four days post-discharge and has not required it since. She was also prescribed a five-day course of prednisone and did not require oxygen upon discharge.  The patient has a history of recurrent bronchitis, typically twice a year. She denies any history of asthma or smoking, but reports exposure to secondhand smoke from her mother. 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      No Known  Allergies  Immunization History  Administered Date(s) Administered   Influenza-Unspecified 12/23/2017, 12/24/2019, 12/30/2022   Pneumococcal Polysaccharide-23 10/19/2020   Pneumococcal-Unspecified 06/09/2020, 10/19/2020   Tdap 05/28/2019   Zoster, Live 04/11/2020, 07/09/2020    Past Medical History:  Diagnosis Date   Diverticulitis    Hypertension    Renal disorder     Tobacco History: Social History   Tobacco Use  Smoking Status Never  Smokeless Tobacco Never   Counseling given: Not Answered   Outpatient Medications Prior to Visit  Medication Sig Dispense Refill   albuterol (VENTOLIN HFA) 108 (90 Base) MCG/ACT inhaler Inhale 1-2 puffs into the lungs every 4 (four) hours as needed for wheezing or shortness of breath. 18 g 0   amLODipine (NORVASC) 2.5 MG tablet Take 2.5 mg by mouth daily.     clobetasol (TEMOVATE) 0.05 % external solution Apply 1 Application topically daily.     finasteride (PROSCAR) 5 MG tablet Take 2.5 mg by mouth daily.     ipratropium-albuterol (DUONEB) 0.5-2.5 (3) MG/3ML SOLN Take 3 mLs by nebulization every 4 (four) hours as needed. 720 mL 0   lisinopril-hydrochlorothiazide (PRINZIDE,ZESTORETIC) 20-12.5 MG tablet Take 2 tablets by mouth daily.     Multiple Vitamin (MULTIVITAMIN WITH MINERALS) TABS tablet Take 1 tablet by mouth daily.     Multiple Vitamins-Minerals (ICAPS) TABS Take 1 tablet by mouth daily.     OZEMPIC, 1 MG/DOSE, 4 MG/3ML SOPN Inject 1 mg into the skin once a week. Wednesdays     rosuvastatin (CRESTOR) 20 MG tablet Take 20 mg by mouth every evening.     acetaminophen (TYLENOL) 500 MG tablet Take  500 mg by mouth every 6 (six) hours as needed for mild pain or moderate pain.     No facility-administered medications prior to visit.   Review of Systems  Review of Systems  Constitutional: Negative.   HENT: Negative.    Respiratory: Negative.    Cardiovascular: Negative.    Physical Exam  BP 128/68 (BP Location: Left Arm, Cuff  Size: Normal)   Pulse 77   Ht 5\' 5"  (1.651 m)   Wt 223 lb (101.2 kg)   SpO2 98%   BMI 37.11 kg/m  Physical Exam Constitutional:      General: She is not in acute distress.    Appearance: Normal appearance. She is not ill-appearing.  HENT:     Head: Normocephalic and atraumatic.     Mouth/Throat:     Mouth: Mucous membranes are moist.     Pharynx: Oropharynx is clear.  Cardiovascular:     Rate and Rhythm: Normal rate and regular rhythm.  Pulmonary:     Effort: Pulmonary effort is normal.     Breath sounds: Normal breath sounds.  Musculoskeletal:        General: Normal range of motion.  Skin:    General: Skin is warm and dry.  Neurological:     General: No focal deficit present.     Mental Status: She is alert and oriented to person, place, and time. Mental status is at baseline.  Psychiatric:        Mood and Affect: Mood normal.        Behavior: Behavior normal.        Thought Content: Thought content normal.        Judgment: Judgment normal.      Lab Results:  CBC    Component Value Date/Time   WBC 12.8 (H) 02/10/2023 0721   RBC 4.50 02/10/2023 0721   HGB 13.0 02/10/2023 0721   HCT 41.1 02/10/2023 0721   PLT 219 02/10/2023 0721   MCV 91.3 02/10/2023 0721   MCH 28.9 02/10/2023 0721   MCHC 31.6 02/10/2023 0721   RDW 14.4 02/10/2023 0721   LYMPHSABS 1.9 02/10/2023 0721   MONOABS 1.1 (H) 02/10/2023 0721   EOSABS 0.7 (H) 02/10/2023 0721   BASOSABS 0.1 02/10/2023 0721    BMET    Component Value Date/Time   NA 141 02/10/2023 0721   K 3.8 02/10/2023 0721   CL 101 02/10/2023 0721   CO2 30 02/10/2023 0721   GLUCOSE 89 02/10/2023 0721   BUN 14 02/10/2023 0721   CREATININE 0.93 02/10/2023 0721   CALCIUM 8.7 (L) 02/10/2023 0721   GFRNONAA >60 02/10/2023 0721   GFRAA >60 11/05/2017 1734    BNP No results found for: "BNP"  ProBNP No results found for: "PROBNP"  Imaging: ECHOCARDIOGRAM COMPLETE Result Date: 02/11/2023    ECHOCARDIOGRAM REPORT   Patient  Name:   Melinda Stevens Date of Exam: 02/11/2023 Medical Rec #:  [id number]      Height:       65.0 in Accession #:    [id number]     Weight:       217.8 lb Date of Birth:  [birth year]      BSA:          2.051 m Patient Age:    67 years       BP:           142/83 mmHg Patient Gender: F  HR:           100 bpm. Exam Location:  Inpatient Procedure: 2D Echo, Cardiac Doppler and Color Doppler Indications:    HTN  History:        Patient has no prior history of Echocardiogram examinations.                 Arrythmias:Tachycardia; Risk Factors:Hypertension.  Sonographer:    Karma Ganja Referring Phys: [id number] BINAYA DAHAL IMPRESSIONS  1. Left ventricular ejection fraction, by estimation, is 70 to 75%. The left ventricle has hyperdynamic function. The left ventricle has no regional wall motion abnormalities. There is moderate asymmetric left ventricular hypertrophy of the septal segment. Left ventricular diastolic parameters are consistent with Grade I diastolic dysfunction (impaired relaxation).  2. Right ventricular systolic function is hyperdynamic. The right ventricular size is normal.  3. The mitral valve was not well visualized. No evidence of mitral valve regurgitation.  4. The aortic valve was not well visualized. Aortic valve regurgitation is not visualized. No aortic stenosis is present. Comparison(s): No prior Echocardiogram. FINDINGS  Left Ventricle: Left ventricular ejection fraction, by estimation, is 70 to 75%. The left ventricle has hyperdynamic function. The left ventricle has no regional wall motion abnormalities. The left ventricular internal cavity size was normal in size. There is moderate asymmetric left ventricular hypertrophy of the septal segment. Left ventricular diastolic parameters are consistent with Grade I diastolic dysfunction (impaired relaxation). Right Ventricle: The right ventricular size is normal. No increase in right ventricular wall thickness. Right ventricular systolic  function is hyperdynamic. Left Atrium: Left atrial size was normal in size. Right Atrium: Right atrial size was normal in size. Pericardium: There is no evidence of pericardial effusion. Mitral Valve: The mitral valve was not well visualized. No evidence of mitral valve regurgitation. Tricuspid Valve: The tricuspid valve is normal in structure. Tricuspid valve regurgitation is not demonstrated. No evidence of tricuspid stenosis. Aortic Valve: The aortic valve was not well visualized. Aortic valve regurgitation is not visualized. No aortic stenosis is present. Aortic valve mean gradient measures 6.0 mmHg. Aortic valve peak gradient measures 10.5 mmHg. Aortic valve area, by VTI measures 2.84 cm. Pulmonic Valve: The pulmonic valve was not well visualized. Pulmonic valve regurgitation is not visualized. No evidence of pulmonic stenosis. Aorta: The aortic root and ascending aorta are structurally normal, with no evidence of dilitation. IAS/Shunts: The interatrial septum was not well visualized.  LEFT VENTRICLE PLAX 2D LVIDd:         4.60 cm   Diastology LVIDs:         3.00 cm   LV e' medial:    8.27 cm/s LV PW:         1.10 cm   LV E/e' medial:  6.6 LV IVS:        1.30 cm   LV e' lateral:   7.94 cm/s LVOT diam:     2.00 cm   LV E/e' lateral: 6.9 LV SV:         68 LV SV Index:   33 LVOT Area:     3.14 cm  RIGHT VENTRICLE RV Basal diam:  4.00 cm RV S prime:     22.60 cm/s TAPSE (M-mode): 2.5 cm LEFT ATRIUM             Index        RIGHT ATRIUM           Index LA diam:        4.00 cm  1.95 cm/m   RA Area:     19.50 cm LA Vol (A2C):   39.5 ml 19.26 ml/m  RA Volume:   59.50 ml  29.01 ml/m LA Vol (A4C):   50.3 ml 24.52 ml/m LA Biplane Vol: 44.5 ml 21.69 ml/m  AORTIC VALVE AV Area (Vmax):    2.66 cm AV Area (Vmean):   2.60 cm AV Area (VTI):     2.84 cm AV Vmax:           162.00 cm/s AV Vmean:          109.000 cm/s AV VTI:            0.238 m AV Peak Grad:      10.5 mmHg AV Mean Grad:      6.0 mmHg LVOT Vmax:          137.00 cm/s LVOT Vmean:        90.200 cm/s LVOT VTI:          0.215 m LVOT/AV VTI ratio: 0.90  AORTA Ao Root diam: 2.90 cm Ao Asc diam:  2.80 cm MITRAL VALVE MV Area (PHT): 4.86 cm    SHUNTS MV Decel Time: 156 msec    Systemic VTI:  0.22 m MV E velocity: 54.40 cm/s  Systemic Diam: 2.00 cm MV A velocity: 85.30 cm/s MV E/A ratio:  0.64 Riley Lam MD Electronically signed by Riley Lam MD Signature Date/Time: 02/11/2023/3:35:03 PM    Final    CT CHEST WO CONTRAST Result Date: 02/10/2023 CLINICAL DATA:  Chronic dyspnea EXAM: CT CHEST WITHOUT CONTRAST TECHNIQUE: Multidetector CT imaging of the chest was performed following the standard protocol without IV contrast. RADIATION DOSE REDUCTION: This exam was performed according to the departmental dose-optimization program which includes automated exposure control, adjustment of the mA and/or kV according to patient size and/or use of iterative reconstruction technique. COMPARISON:  Chest x-ray 02/08/2023 FINDINGS: Cardiovascular: Limited evaluation without intravenous contrast. Nonaneurysmal aorta. Normal cardiac size. No pericardial effusion Mediastinum/Nodes: Midline trachea. No thyroid mass. Borderline mediastinal lymph nodes. Esophagus within normal limits. Lungs/Pleura: No pleural effusion or pneumothorax. Ground-glass density within the left lower lobe with slight peribronchovascular distribution. Additional foci of mild ground-glass density in the right middle lobe, right lower lobe and left upper lobe. Upper Abdomen: No acute finding small adrenal nodules consistent with adenoma, no change, no imaging follow-up recommended Musculoskeletal: No acute osseous abnormality IMPRESSION: 1. Ground-glass density within the left lower lobe with additional foci of mild ground-glass density in the right middle lobe, right lower lobe and left upper lobe. Findings could be secondary to multifocal infectious pneumonia but could also consider organizing  pneumonia or other inflammatory lung disease. Imaging follow-up to resolution is also recommended to exclude neoplasm. 2. Borderline mediastinal lymph nodes, likely reactive. Electronically Signed   By: Jasmine Pang M.D.   On: 02/10/2023 23:23   DG Chest 2 View Result Date: 02/08/2023 CLINICAL DATA:  Shortness of breath EXAM: CHEST - 2 VIEW COMPARISON:  05/02/2016 FINDINGS: The heart size and mediastinal contours are within normal limits. Mild streaky left basilar opacity. Right lung is clear. No pleural effusion or pneumothorax. The visualized skeletal structures are unremarkable. IMPRESSION: Mild streaky left basilar opacity, favor atelectasis. Electronically Signed   By: Duanne Guess D.O.   On: 02/08/2023 12:43     Assessment & Plan:   1. Community acquired pneumonia, unspecified laterality (Primary) - CT CHEST HIGH RESOLUTION; Future - Pulmonary function test; Future      Community  Acquired Pneumonia Hospitalized from 11/30 to 12/4. Initial outpatient treatment with azithromycin and Augmentin were unsuccessful. Improved with Levaquin, discharged on 5 day course of oral prednisone. Currently asymptomatic except for occasional raspy cough with clear sputum. No current need for nebulizer or oxygen. -Order repeat CT scan in 2-4 weeks to ensure resolution of pneumonia.  Chronic Bronchitis Reports recurrent bronchitis twice a year. No history of asthma. She was exposed to second hand smoke.  -Schedule pulmonary function tests in 6 months due to history of recurrent bronchitis. -Advise patient to seek medical attention at onset of bronchitis symptoms.  Raspy Cough Residual symptom post-pneumonia. No significant interference with daily activities. Reports morning nasal congestion. -Advise trial of honey lozenges or honey with warm water/tea for cough. -Recommend over-the-counter nasal spray (Flonase or Nasacort) or Simply Saline spray for nasal congestion.      Glenford Bayley,  NP 03/06/2023

## 2023-03-06 NOTE — Patient Instructions (Addendum)
-  COMMUNITY ACQUIRED PNEUMONIA: You were hospitalized and treated with Levaquin after initial treatments were unsuccessful. You are now feeling better with only a residual cough. We will order a repeat CT scan in 2-4 weeks to ensure the pneumonia has fully resolved.   -CHRONIC BRONCHITIS: Chronic bronchitis is a condition characterized by recurrent inflammation of the bronchial tubes, leading to cough and mucus production. You report having bronchitis twice a year. We will schedule pulmonary function tests in 6 months to monitor your lung function. Please seek medical attention at the onset of bronchitis symptoms.  -RASPY COUGH: We recommend trying honey lozenges or honey with warm water/tea for the cough and also delsym OTC cough syrup. For your morning nasal congestion, you can use over-the-counter nasal sprays like Flonase or Nasacort, or Simply Saline spray. You can also use nebulizer every 6 hours as needed for sob/wheezing or cough.   Orders: Repeat CT scan in 2-4 weeks to ensure the pneumonia has resolved.  INSTRUCTIONS: FU in 6 months with Dr. Sherene Sires and schedule pulmonary function tests in 6 months.   If you experience any new or worsening symptoms, seek medical attention promptly.

## 2023-03-10 ENCOUNTER — Other Ambulatory Visit: Payer: Self-pay

## 2023-03-27 DIAGNOSIS — E876 Hypokalemia: Secondary | ICD-10-CM | POA: Diagnosis not present

## 2023-03-27 DIAGNOSIS — E1169 Type 2 diabetes mellitus with other specified complication: Secondary | ICD-10-CM | POA: Diagnosis not present

## 2023-03-31 DIAGNOSIS — E7800 Pure hypercholesterolemia, unspecified: Secondary | ICD-10-CM | POA: Diagnosis not present

## 2023-03-31 DIAGNOSIS — E1169 Type 2 diabetes mellitus with other specified complication: Secondary | ICD-10-CM | POA: Diagnosis not present

## 2023-03-31 DIAGNOSIS — I10 Essential (primary) hypertension: Secondary | ICD-10-CM | POA: Diagnosis not present

## 2023-04-09 ENCOUNTER — Ambulatory Visit (HOSPITAL_COMMUNITY)
Admission: RE | Admit: 2023-04-09 | Discharge: 2023-04-09 | Disposition: A | Discharge status: Home / Self Care | Payer: Medicare Other | Source: Ambulatory Visit | Attending: Primary Care | Admitting: Primary Care

## 2023-04-09 DIAGNOSIS — R9389 Abnormal findings on diagnostic imaging of other specified body structures: Secondary | ICD-10-CM | POA: Diagnosis not present

## 2023-04-09 DIAGNOSIS — I700 Atherosclerosis of aorta: Secondary | ICD-10-CM | POA: Diagnosis not present

## 2023-04-09 DIAGNOSIS — J189 Pneumonia, unspecified organism: Secondary | ICD-10-CM | POA: Diagnosis present

## 2023-04-09 DIAGNOSIS — R053 Chronic cough: Secondary | ICD-10-CM | POA: Diagnosis not present

## 2023-04-22 ENCOUNTER — Ambulatory Visit (HOSPITAL_COMMUNITY)
Admission: RE | Admit: 2023-04-22 | Discharge: 2023-04-22 | Disposition: A | Discharge status: Home / Self Care | Payer: Medicare Other | Source: Ambulatory Visit | Attending: Primary Care | Admitting: Primary Care

## 2023-04-22 DIAGNOSIS — J189 Pneumonia, unspecified organism: Secondary | ICD-10-CM | POA: Insufficient documentation

## 2023-04-22 LAB — PULMONARY FUNCTION TEST
DL/VA % pred: 110 %
DL/VA: 4.57 ml/min/mmHg/L
DLCO unc % pred: 95 %
DLCO unc: 19.14 ml/min/mmHg
FEF 25-75 Post: 1.25 L/s
FEF 25-75 Pre: 1.24 L/s
FEF2575-%Change-Post: 0 %
FEF2575-%Pred-Post: 59 %
FEF2575-%Pred-Pre: 59 %
FEV1-%Change-Post: 0 %
FEV1-%Pred-Post: 72 %
FEV1-%Pred-Pre: 72 %
FEV1-Post: 1.77 L
FEV1-Pre: 1.77 L
FEV1FVC-%Change-Post: -2 %
FEV1FVC-%Pred-Pre: 88 %
FEV6-%Change-Post: 4 %
FEV6-%Pred-Post: 86 %
FEV6-%Pred-Pre: 82 %
FEV6-Post: 2.67 L
FEV6-Pre: 2.55 L
FEV6FVC-%Pred-Post: 104 %
FEV6FVC-%Pred-Pre: 104 %
FVC-%Change-Post: 2 %
FVC-%Pred-Post: 82 %
FVC-%Pred-Pre: 81 %
FVC-Post: 2.67 L
FVC-Pre: 2.61 L
Post FEV1/FVC ratio: 66 %
Post FEV6/FVC ratio: 100 %
Pre FEV1/FVC ratio: 68 %
Pre FEV6/FVC Ratio: 100 %
RV % pred: 101 %
RV: 2.21 L
TLC % pred: 93 %
TLC: 4.85 L

## 2023-04-22 MED ORDER — ALBUTEROL SULFATE (2.5 MG/3ML) 0.083% IN NEBU
2.5000 mg | INHALATION_SOLUTION | Freq: Once | RESPIRATORY_TRACT | Status: AC
  Administered 2023-04-22: 2.5 mg via RESPIRATORY_TRACT

## 2023-04-23 NOTE — Progress Notes (Signed)
Please let patient know breathing testing showed minimal obstructive airway disease which you will see with asthma, COPD or chronic bronchitis.

## 2023-04-24 ENCOUNTER — Other Ambulatory Visit (HOSPITAL_BASED_OUTPATIENT_CLINIC_OR_DEPARTMENT_OTHER): Payer: Self-pay | Admitting: Primary Care

## 2023-04-24 DIAGNOSIS — R911 Solitary pulmonary nodule: Secondary | ICD-10-CM

## 2023-04-28 ENCOUNTER — Ambulatory Visit: Payer: Medicare Other | Admitting: Primary Care

## 2023-04-28 ENCOUNTER — Ambulatory Visit (HOSPITAL_COMMUNITY)
Admission: RE | Admit: 2023-04-28 | Discharge: 2023-04-28 | Disposition: A | Discharge status: Home / Self Care | Payer: Medicare Other | Source: Ambulatory Visit | Attending: Primary Care | Admitting: Primary Care

## 2023-04-28 ENCOUNTER — Telehealth: Payer: Self-pay | Admitting: Primary Care

## 2023-04-28 ENCOUNTER — Encounter: Payer: Self-pay | Admitting: Primary Care

## 2023-04-28 VITALS — BP 144/75 | HR 93 | Ht 65.0 in | Wt 225.0 lb

## 2023-04-28 DIAGNOSIS — R0602 Shortness of breath: Secondary | ICD-10-CM | POA: Diagnosis not present

## 2023-04-28 DIAGNOSIS — R911 Solitary pulmonary nodule: Secondary | ICD-10-CM | POA: Diagnosis not present

## 2023-04-28 DIAGNOSIS — J189 Pneumonia, unspecified organism: Secondary | ICD-10-CM | POA: Diagnosis not present

## 2023-04-28 DIAGNOSIS — J208 Acute bronchitis due to other specified organisms: Secondary | ICD-10-CM

## 2023-04-28 DIAGNOSIS — R079 Chest pain, unspecified: Secondary | ICD-10-CM | POA: Diagnosis not present

## 2023-04-28 DIAGNOSIS — K8020 Calculus of gallbladder without cholecystitis without obstruction: Secondary | ICD-10-CM

## 2023-04-28 MED ORDER — METHYLPREDNISOLONE ACETATE 80 MG/ML IJ SUSP
80.0000 mg | Freq: Once | INTRAMUSCULAR | Status: AC
  Administered 2023-04-28: 80 mg via INTRAMUSCULAR

## 2023-04-28 MED ORDER — GUAIFENESIN-DM 100-10 MG/5ML PO SYRP: 5.0000 mL | ORAL_SOLUTION | ORAL | 0 refills | Status: DC | PRN

## 2023-04-28 MED ORDER — ALBUTEROL SULFATE (2.5 MG/3ML) 0.083% IN NEBU
2.5000 mg | INHALATION_SOLUTION | Freq: Once | RESPIRATORY_TRACT | Status: AC
  Administered 2023-04-28: 2.5 mg via RESPIRATORY_TRACT

## 2023-04-28 MED ORDER — PREDNISONE 10 MG PO TABS: ORAL_TABLET | ORAL | 0 refills | Status: DC

## 2023-04-28 MED ORDER — LEVOFLOXACIN 500 MG PO TABS: 500.0000 mg | ORAL_TABLET | Freq: Every day | ORAL | 0 refills | Status: DC

## 2023-04-28 NOTE — Telephone Encounter (Signed)
Tried to call [facility] Triage but they were unavailable. Patient is experiencing wheezing, cough and shortness of breath. There are no acute appointments in [place] today. Please call and advise.

## 2023-04-28 NOTE — Progress Notes (Signed)
@Patient  ID: Melinda Stevens, female    DOB: 19-Nov-1955, 68 y.o.   MRN: [id number]  Chief Complaint  Patient presents with   Acute Visit   Cough    X3-4 days, somewhat productive; wheezing & shob; no OTC meds    Referring provider: Donetta Potts, MD  HPI: 68 year old female, never smoked. PMH significant for HTN, CAP, acute respiratory failure with hypoxia, acute bronchospasm, obesity.   Previous [facility] pulmonary encounter:  03/06/2023 Discussed the use of AI scribe software for clinical note transcription with the patient, who gave verbal consent to proceed.  History of Present Illness   The patient, with a recent hospitalization in November 2024 for community-acquired pneumonia, presents for a follow-up visit. She initially experienced cough, congestion, and shortness of breath approximately 2.5 weeks prior to hospitalization. Despite treatment with a Z-Pak and steroids, followed by Augmentin and an albuterol inhaler, symptoms persisted, including a cough with green sputum.  Upon presentation to the ED, lab work revealed an elevated white blood cell count of 16, and chest x-rays indicated a likely left lower lobe pneumonia. The patient was started on Levaquin due to the failure of previous antibiotics and was hospitalized for five days.  Since discharge three weeks ago, the patient reports feeling significantly better, with only a residual raspy cough. She used a prescribed nebulizer for three to four days post-discharge and has not required it since. She was also prescribed a five-day course of prednisone and did not require oxygen upon discharge.  The patient has a history of recurrent bronchitis, typically twice a year. She denies any history of asthma or smoking, but reports exposure to secondhand smoke from her mother. 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     04/28/2023- interim hx Discussed the use of AI scribe  software for clinical note transcription with the patient, who gave verbal consent to proceed.  History of Present Illness   Melinda Stevens is a 68 year old female with recurrent bronchitis who presents with cough, wheezing, and shortness of breath.  She has been experiencing a cough for the past three to four days, which she describes as 'nagging' and somewhat productive. The cough is less severe than during her previous pneumonia episode. She also has wheezing and shortness of breath, but no fever or chills.  Her medical history includes recurrent bronchitis, typically occurring twice a year, and a hospitalization in November for community-acquired pneumonia. During that episode, she was treated with a Z-Pak, prednisone, Augmentin, and albuterol. A chest x-ray at that time showed left lower lobe pneumonia, and she was subsequently treated with Levaquin. She was discharged on a five-day course of prednisone and did not require oxygen upon discharge.  She has been using a nebulizer at home, which provides short-term relief but sometimes induces coughing. She has not used any over-the-counter medications yet. She received a flu shot this year.  In January 2025, a CT scan showed no fibrotic lung disease but did reveal a small, stable 7 mm nodule, which is recommended to be followed up in two years. She also has a tiny gallstone, but it is not related to her current symptoms.  Socially, she is retired and not frequently exposed to large groups of people. She has a history of secondhand smoke exposure from her mother.   Pulmonary function testing in February 2025 showed minimal obstructive airway disease/ FEV1 72%.      No  Known Allergies  Immunization History  Administered Date(s) Administered   Influenza-Unspecified 12/23/2017, 12/24/2019, 12/30/2022   Pneumococcal Polysaccharide-23 10/19/2020   Pneumococcal-Unspecified 06/09/2020, 10/19/2020   Tdap 05/28/2019   Zoster, Live 04/11/2020,  07/09/2020    Past Medical History:  Diagnosis Date   Diverticulitis    Hypertension    Renal disorder     Tobacco History: Social History   Tobacco Use  Smoking Status Never  Smokeless Tobacco Never   Counseling given: Not Answered   Outpatient Medications Prior to Visit  Medication Sig Dispense Refill   albuterol (VENTOLIN HFA) 108 (90 Base) MCG/ACT inhaler Inhale 1-2 puffs into the lungs every 4 (four) hours as needed for wheezing or shortness of breath. 18 g 0   amLODipine (NORVASC) 2.5 MG tablet Take 2.5 mg by mouth daily.     clobetasol (TEMOVATE) 0.05 % external solution Apply 1 Application topically daily.     finasteride (PROSCAR) 5 MG tablet Take 2.5 mg by mouth daily.     lisinopril-hydrochlorothiazide (PRINZIDE,ZESTORETIC) 20-12.5 MG tablet Take 2 tablets by mouth daily.     Multiple Vitamin (MULTIVITAMIN WITH MINERALS) TABS tablet Take 1 tablet by mouth daily.     Multiple Vitamins-Minerals (ICAPS) TABS Take 1 tablet by mouth daily.     OZEMPIC, 1 MG/DOSE, 4 MG/3ML SOPN Inject 1 mg into the skin once a week. Wednesdays     rosuvastatin (CRESTOR) 20 MG tablet Take 20 mg by mouth every evening.     ipratropium-albuterol (DUONEB) 0.5-2.5 (3) MG/3ML SOLN Take 3 mLs by nebulization every 4 (four) hours as needed. 720 mL 0   No facility-administered medications prior to visit.      Review of Systems  Review of Systems  Constitutional:  Negative for chills, diaphoresis and fever.  HENT:  Positive for congestion.   Respiratory:  Positive for cough and wheezing.   Cardiovascular: Negative.    Physical Exam  BP (!) 144/75   Pulse 97   Ht 5\' 5"  (1.651 m)   Wt 225 lb (102.1 kg)   SpO2 (!) 89%   BMI 37.44 kg/m  Physical Exam Constitutional:      General: She is not in acute distress.    Appearance: Normal appearance. She is ill-appearing.  Cardiovascular:     Rate and Rhythm: Normal rate and regular rhythm.  Pulmonary:     Effort: Pulmonary effort is  normal.     Breath sounds: Wheezing and rhonchi present.  Skin:    General: Skin is warm and dry.  Neurological:     General: No focal deficit present.     Mental Status: She is alert and oriented to person, place, and time. Mental status is at baseline.  Psychiatric:        Mood and Affect: Mood normal.        Behavior: Behavior normal.        Thought Content: Thought content normal.        Judgment: Judgment normal.      Lab Results:  CBC    Component Value Date/Time   WBC 12.8 (H) 02/10/2023 0721   RBC 4.50 02/10/2023 0721   HGB 13.0 02/10/2023 0721   HCT 41.1 02/10/2023 0721   PLT 219 02/10/2023 0721   MCV 91.3 02/10/2023 0721   MCH 28.9 02/10/2023 0721   MCHC 31.6 02/10/2023 0721   RDW 14.4 02/10/2023 0721   LYMPHSABS 1.9 02/10/2023 0721   MONOABS 1.1 (H) 02/10/2023 0721   EOSABS 0.7 (H) 02/10/2023 9629  BASOSABS 0.1 02/10/2023 0721    BMET    Component Value Date/Time   NA 141 02/10/2023 0721   K 3.8 02/10/2023 0721   CL 101 02/10/2023 0721   CO2 30 02/10/2023 0721   GLUCOSE 89 02/10/2023 0721   BUN 14 02/10/2023 0721   CREATININE 0.93 02/10/2023 0721   CALCIUM 8.7 (L) 02/10/2023 0721   GFRNONAA >60 02/10/2023 0721   GFRAA >60 11/05/2017 1734    BNP No results found for: "BNP"  ProBNP No results found for: "PROBNP"  Imaging: CT CHEST HIGH RESOLUTION Result Date: 04/18/2023 CLINICAL DATA:  Chronic cough. EXAM: CT CHEST WITHOUT CONTRAST TECHNIQUE: Multidetector CT imaging of the chest was performed following the standard protocol without intravenous contrast. High resolution imaging of the lungs, as well as inspiratory and expiratory imaging, was performed. RADIATION DOSE REDUCTION: This exam was performed according to the departmental dose-optimization program which includes automated exposure control, adjustment of the mA and/or kV according to patient size and/or use of iterative reconstruction technique. COMPARISON:  02/10/2023. FINDINGS:  Cardiovascular: Heart is enlarged. No pericardial effusion. Minimal atherosclerotic calcification of the aorta. Mediastinum/Nodes: No pathologically enlarged mediastinal or axillary lymph nodes. Hilar regions are difficult to definitively evaluate without IV contrast. Esophagus is grossly unremarkable. Lungs/Pleura: 7 mm ground-glass nodule in the superior segment right lower lobe (5/70), stable. Subsegmental volume loss in the right middle and left lower lobes. Negative for subpleural reticulation, traction bronchiectasis/bronchiolectasis, ground glass, architectural distortion or honeycombing. Additional scattered pulmonary parenchymal scarring. Mild bronchiectasis and mucoid impaction in the medial right lower lobe. No pleural fluid. Adherent debris in the airway. Expiratory phase imaging was not performed in true expiration, limiting the evaluation for air trapping. Upper Abdomen: Tiny gallstone. Right adrenal nodule measures 1.9 cm and 30 Hounsfield units and left adrenal nodule, 1.3 cm and 24 Hounsfield units, characterized as adenomas on MR abdomen 10/15/2022. No specific follow-up necessary. Elevated left hemidiaphragm. Visualized portions of the liver, gallbladder, adrenal glands, kidneys, spleen, pancreas, stomach and bowel are otherwise grossly unremarkable. No upper abdominal adenopathy. Musculoskeletal: Degenerative changes in the spine. IMPRESSION: 1. No evidence of fibrotic interstitial lung disease. 2. 7 mm ground-glass right lower lobe nodule, stable. Additional follow-up could be obtained in 2 years, for a total of 5 years, as adenocarcinoma cannot be definitively excluded. This recommendation follows the consensus statement: Guidelines for Management of Small Pulmonary Nodules Detected on CT Images: From the Fleischner Society 2017; Radiology 2017; 284:228-243. 3. Tiny gallstone. 4. Bilateral adrenal adenomas. 5.  Aortic atherosclerosis (ICD10-I70.0). Electronically Signed   By: Leanna Battles  M.D.   On: 04/18/2023 11:55     Assessment & Plan:   1. Community acquired pneumonia, unspecified laterality (Primary)  2. Acute bronchitis due to other specified organisms - DG Chest 2 View; Future  Assessment and Plan    Recurrent Bronchitis New onset of cough, wheezing, and shortness of breath since Friday. No fever or chills. Recent history of pneumonia in November. CT scan in January showed no fibrotic lung disease but a stable small nodule. -Order chest x-ray today. -Administer nebulizer treatment and depo-medrol 80mg  IM shot in office today. -Prescribe Levaquin 500mg  daily x 7 days and prednisone taper -Advise patient to continue using nebulizer at home every six hours for the next five days. -Recommend Mucinex DM or Robitussin DM for cough and mucus thinning. -Advise patient to monitor oxygen levels at home and seek emergency care if levels remain in the mid 80s after rest and nebulizer use.  Pulmonary Nodule Stable 7mm nodule noted on CT scan in January. Low risk due to non-smoker status. -Plan to repeat CT scan in two years for a total of five years of stability monitoring.  Gallstone Small gallstone noted on CT scan in January. No current symptoms related to gallstone. -No intervention needed at this time.      Glenford Bayley, NP 04/28/2023

## 2023-04-28 NOTE — Addendum Note (Signed)
Addended by: Shelby Dubin on: 04/28/2023 10:05 AM   Modules accepted: Orders

## 2023-04-28 NOTE — Telephone Encounter (Signed)
Patient scheduled for OV today.

## 2023-04-28 NOTE — Patient Instructions (Addendum)
-  RECURRENT BRONCHITIS: Recurrent bronchitis is a condition where the bronchial tubes in your lungs become inflamed frequently, causing cough and difficulty breathing. We will order a chest x-ray today and provide a nebulizer treatment and steroid shot in the office. You will also start on Levaquin, an antibiotic. Continue using your nebulizer at home every six hours for the next five days and consider taking Mucinex DM or Robitussin DM to help with your cough and mucus. Monitor your oxygen levels at home and seek emergency care if they remain low after rest and nebulizer use.  -PULMONARY NODULE: A pulmonary nodule is a small, round growth in the lung. Your nodule is stable and low risk since you are not a smoker. We will repeat the CT scan in two years to ensure it remains stable.  -GALLSTONE: A gallstone is a small, hard deposit that forms in the gallbladder. Your gallstone is small and not causing any symptoms, so no treatment is needed at this time.  INSTRUCTIONS:  Please follow up with a chest x-ray today. You received nebulizer treatment today I office and depo-medrol injection. Continue using your nebulizer every six hours for the next five days and take the prescribed Levaquin.  Start prednisone taper tomorrow. Monitor your oxygen levels and seek emergency care if they remain low. We will repeat the CT scan for your pulmonary nodule in two years.  Follow-up: 3 months with Beth NP or contact us sooner if symptoms are not improving

## 2023-04-29 NOTE — Progress Notes (Signed)
 Please let patient know CXR was clear

## 2023-05-06 DIAGNOSIS — L649 Androgenic alopecia, unspecified: Secondary | ICD-10-CM | POA: Diagnosis not present

## 2023-05-06 DIAGNOSIS — L570 Actinic keratosis: Secondary | ICD-10-CM | POA: Diagnosis not present

## 2023-05-12 ENCOUNTER — Other Ambulatory Visit (HOSPITAL_COMMUNITY): Payer: Self-pay

## 2023-06-19 DIAGNOSIS — Z1231 Encounter for screening mammogram for malignant neoplasm of breast: Secondary | ICD-10-CM | POA: Diagnosis not present

## 2023-07-15 DIAGNOSIS — E1169 Type 2 diabetes mellitus with other specified complication: Secondary | ICD-10-CM | POA: Diagnosis not present

## 2023-07-22 DIAGNOSIS — E1169 Type 2 diabetes mellitus with other specified complication: Secondary | ICD-10-CM | POA: Diagnosis not present

## 2023-07-22 DIAGNOSIS — E7800 Pure hypercholesterolemia, unspecified: Secondary | ICD-10-CM | POA: Diagnosis not present

## 2023-07-22 DIAGNOSIS — I10 Essential (primary) hypertension: Secondary | ICD-10-CM | POA: Diagnosis not present

## 2023-07-23 ENCOUNTER — Ambulatory Visit: Payer: Medicare Other | Admitting: Primary Care

## 2023-07-23 VITALS — BP 120/68 | HR 98 | Ht 65.0 in | Wt 224.2 lb

## 2023-07-23 DIAGNOSIS — J449 Chronic obstructive pulmonary disease, unspecified: Secondary | ICD-10-CM

## 2023-07-23 DIAGNOSIS — J189 Pneumonia, unspecified organism: Secondary | ICD-10-CM

## 2023-07-23 DIAGNOSIS — J40 Bronchitis, not specified as acute or chronic: Secondary | ICD-10-CM | POA: Diagnosis not present

## 2023-07-23 NOTE — Progress Notes (Signed)
 @Patient  ID: Melinda Stevens, female    DOB: [birth year], 68 y.o.   MRN: [id number]  No chief complaint on file.   Referring provider: Lauran Pollard, MD   HPI: 68 year old female, never smoked. PMH significant for HTN, CAP, acute respiratory failure with hypoxia, acute bronchospasm, obesity.   Previous [facility] pulmonary encounter:  03/06/2023 Discussed the use of AI scribe software for clinical note transcription with the patient, who gave verbal consent to proceed.  History of Present Illness   The patient, with a recent hospitalization in November 2024 for community-acquired pneumonia, presents for a follow-up visit. She initially experienced cough, congestion, and shortness of breath approximately 2.5 weeks prior to hospitalization. Despite treatment with a Z-Pak and steroids, followed by Augmentin and an albuterol  inhaler, symptoms persisted, including a cough with green sputum.  Upon presentation to the ED, lab work revealed an elevated white blood cell count of 16, and chest x-rays indicated a likely left lower lobe pneumonia. The patient was started on Levaquin  due to the failure of previous antibiotics and was hospitalized for five days.  Since discharge three weeks ago, the patient reports feeling significantly better, with only a residual raspy cough. She used a prescribed nebulizer for three to four days post-discharge and has not required it since. She was also prescribed a five-day course of prednisone  and did not require oxygen upon discharge.  The patient has a history of recurrent bronchitis, typically twice a year. She denies any history of asthma or smoking, but reports exposure to secondhand smoke from her mother. 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     04/28/2023 Discussed the use of AI scribe software for clinical note transcription with the patient, who gave verbal consent to proceed.  History of  Present Illness   Melinda Stevens is a 68 year old female with recurrent bronchitis who presents with cough, wheezing, and shortness of breath.  She has been experiencing a cough for the past three to four days, which she describes as 'nagging' and somewhat productive. The cough is less severe than during her previous pneumonia episode. She also has wheezing and shortness of breath, but no fever or chills.  Her medical history includes recurrent bronchitis, typically occurring twice a year, and a hospitalization in November for community-acquired pneumonia. During that episode, she was treated with a Z-Pak, prednisone , Augmentin, and albuterol . A chest x-ray at that time showed left lower lobe pneumonia, and she was subsequently treated with Levaquin . She was discharged on a five-day course of prednisone  and did not require oxygen upon discharge.  She has been using a nebulizer at home, which provides short-term relief but sometimes induces coughing. She has not used any over-the-counter medications yet. She received a flu shot this year.  In January 2025, a CT scan showed no fibrotic lung disease but did reveal a small, stable 7 mm nodule, which is recommended to be followed up in two years. She also has a tiny gallstone, but it is not related to her current symptoms.  Socially, she is retired and not frequently exposed to large groups of people. She has a history of secondhand smoke exposure from her mother.   Pulmonary function testing in February 2025 showed minimal obstructive airway disease/ FEV1 72%.      1. Community acquired pneumonia, unspecified laterality (Primary)   2. Acute bronchitis due to other specified organisms - DG Chest 2 View; Future  Assessment and Plan    Recurrent Bronchitis New onset of cough, wheezing, and shortness of breath since Friday. No fever or chills. Recent history of pneumonia in November. CT scan in January showed no fibrotic lung disease but a stable  small nodule. -Order chest x-ray today. -Administer nebulizer treatment and depo-medrol  80mg  IM shot in office today. -Prescribe Levaquin  500mg  daily x 7 days and prednisone  taper -Advise patient to continue using nebulizer at home every six hours for the next five days. -Recommend Mucinex  DM or Robitussin DM for cough and mucus thinning. -Advise patient to monitor oxygen levels at home and seek emergency care if levels remain in the mid 80s after rest and nebulizer use.   Pulmonary Nodule Stable 7mm nodule noted on CT scan in January. Low risk due to non-smoker status. -Plan to repeat CT scan in two years for a total of five years of stability monitoring.   Gallstone Small gallstone noted on CT scan in January. No current symptoms related to gallstone. -No intervention needed at this time.        07/23/2023- Interim hx  Discussed the use of AI scribe software for clinical note transcription with the patient, who gave verbal consent to proceed.  History of Present Illness   Melinda Stevens is a 67 year old female with recurrent bronchitis who presents for a follow-up visit.  She was hospitalized in November for pneumonia and was seen again in February for recurrent bronchitis. During the February visit, she was treated with a nebulizer, a steroid injection and course of Levaquin . Since then, she has experienced a few episodes of a 'raspy cough' that resolves once she expels phlegm, which she attributes to pollen exposure. She is not currently taking any allergy medications.  She denies day-to-day shortness of breath or wheezing. A breathing test conducted in February showed minimal obstructive lung disease. She has never smoked. Her bronchitis tends to flare up about twice a year, typically in the winter.  A CT scan in January revealed a stable 7 mm pulmonary nodule, which is being monitored. No evidence of fibrotic lung disease. Additionally, a small gallstone was noted on the CT scan. She  denies any symptoms such as abdominal pain, nausea, or vomiting.  She has a nebulizer at home and uses it when symptoms begin. She also uses Mucinex  to help thin mucus.     No Known Allergies  Immunization History  Administered Date(s) Administered   Influenza-Unspecified 12/23/2017, 12/24/2019, 12/30/2022   Pneumococcal Polysaccharide-23 10/19/2020   Pneumococcal-Unspecified 06/09/2020, 10/19/2020   Tdap 05/28/2019   Zoster, Live 04/11/2020, 07/09/2020    Past Medical History:  Diagnosis Date   Diverticulitis    Hypertension    Renal disorder     Tobacco History: Social History   Tobacco Use  Smoking Status Never  Smokeless Tobacco Never   Counseling given: Not Answered   Outpatient Medications Prior to Visit  Medication Sig Dispense Refill   albuterol  (VENTOLIN  HFA) 108 (90 Base) MCG/ACT inhaler Inhale 1-2 puffs into the lungs every 4 (four) hours as needed for wheezing or shortness of breath. 18 g 0   amLODipine (NORVASC) 2.5 MG tablet Take 2.5 mg by mouth daily.     clobetasol (TEMOVATE) 0.05 % external solution Apply 1 Application topically daily.     finasteride (PROSCAR) 5 MG tablet Take 2.5 mg by mouth daily.     guaiFENesin -dextromethorphan  (ROBITUSSIN DM) 100-10 MG/5ML syrup Take 5 mLs by mouth every 4 (four) hours as needed for  cough. 240 mL 0   ipratropium-albuterol  (DUONEB) 0.5-2.5 (3) MG/3ML SOLN Take 3 mLs by nebulization every 4 (four) hours as needed. 720 mL 0   levofloxacin  (LEVAQUIN ) 500 MG tablet Take 1 tablet (500 mg total) by mouth daily. 7 tablet 0   lisinopril -hydrochlorothiazide  (PRINZIDE ,ZESTORETIC ) 20-12.5 MG tablet Take 2 tablets by mouth daily.     Multiple Vitamin (MULTIVITAMIN WITH MINERALS) TABS tablet Take 1 tablet by mouth daily.     Multiple Vitamins-Minerals (ICAPS) TABS Take 1 tablet by mouth daily.     OZEMPIC, 1 MG/DOSE, 4 MG/3ML SOPN Inject 1 mg into the skin once a week. Wednesdays     predniSONE  (DELTASONE ) 10 MG tablet 4 tabs for  3 days, then 3 tabs for 3 days, 2 tabs for 3 days, then 1 tab for 3 days, then stop 30 tablet 0   rosuvastatin  (CRESTOR ) 20 MG tablet Take 20 mg by mouth every evening.     No facility-administered medications prior to visit.    Review of Systems  Review of Systems  Constitutional: Negative.   HENT: Negative.    Respiratory: Negative.    Cardiovascular: Negative.    Physical Exam  There were no vitals taken for this visit. Physical Exam Constitutional:      Appearance: Normal appearance. She is not ill-appearing.  HENT:     Right Ear: Tympanic membrane normal.     Left Ear: Tympanic membrane normal.     Mouth/Throat:     Mouth: Mucous membranes are moist.     Pharynx: Oropharynx is clear.  Cardiovascular:     Rate and Rhythm: Normal rate and regular rhythm.  Pulmonary:     Effort: Pulmonary effort is normal.     Breath sounds: Normal breath sounds.     Comments: CTA Skin:    General: Skin is warm and dry.  Neurological:     General: No focal deficit present.     Mental Status: She is alert and oriented to person, place, and time. Mental status is at baseline.  Psychiatric:        Mood and Affect: Mood normal.        Behavior: Behavior normal.        Thought Content: Thought content normal.        Judgment: Judgment normal.      Lab Results:  CBC    Component Value Date/Time   WBC 12.8 (H) 02/10/2023 0721   RBC 4.50 02/10/2023 0721   HGB 13.0 02/10/2023 0721   HCT 41.1 02/10/2023 0721   PLT 219 02/10/2023 0721   MCV 91.3 02/10/2023 0721   MCH 28.9 02/10/2023 0721   MCHC 31.6 02/10/2023 0721   RDW 14.4 02/10/2023 0721   LYMPHSABS 1.9 02/10/2023 0721   MONOABS 1.1 (H) 02/10/2023 0721   EOSABS 0.7 (H) 02/10/2023 0721   BASOSABS 0.1 02/10/2023 0721    BMET    Component Value Date/Time   NA 141 02/10/2023 0721   K 3.8 02/10/2023 0721   CL 101 02/10/2023 0721   CO2 30 02/10/2023 0721   GLUCOSE 89 02/10/2023 0721   BUN 14 02/10/2023 0721   CREATININE  0.93 02/10/2023 0721   CALCIUM  8.7 (L) 02/10/2023 0721   GFRNONAA >60 02/10/2023 0721   GFRAA >60 11/05/2017 1734    BNP No results found for: "BNP"  ProBNP No results found for: "PROBNP"  Imaging: No results found.   Assessment & Plan:   1. Obstructive lung disease (generalized) (HCC) (Primary) -  Pulmonary Function Test; Future  2. Community acquired pneumonia, unspecified laterality  Assessment and Plan    Recurrent bronchitis Recurrent bronchitis with episodes approximately twice a year, typically in winter. Recent raspy cough with phlegm, likely exacerbated by pollen. No current dyspnea or wheezing. Previous treatment included nebulizer, steroid injection, and Levaquin . Plan includes symptomatic management and preventive measures to avoid progression to pneumonia. - Use nebulizer and guaifenesin  at symptom onset. - Contact clinic if symptoms persist or worsen.  Minimal obstructive lung disease Pulmonary function testing in February 2025 showed minimal obstructive lung disease on previous spirometry, possibly influenced by concurrent illness. No current dyspnea or wheezing. Nonsmoker. Plan to monitor with repeat spirometry in six months unless symptoms worsen. - Repeat spirometry in six months unless symptoms worsen.  Pulmonary nodule monitoring 7 mm pulmonary nodule identified on CT scan in January. Low risk due to nonsmoker status. Plan to monitor for five years with repeat CT scan in January 2027. - Repeat CT scan in January 2027 to monitor pulmonary nodule.  Pneumonia Pneumonia requiring hospitalization in late November 2025. No evidence of fibrotic lung disease on CT imaging. No current symptoms suggestive of recurrence. Preventive measures discussed to avoid future episodes.  Gallstone without symptoms Incidental small gallstone on CT scan. No symptoms such as abdominal pain, nausea, or vomiting. No current GI specialist involvement. Dietary modifications  recommended to prevent symptoms. - Provide dietary guidance to avoid fatty foods.  Antonio Baumgarten, NP 07/23/2023

## 2023-07-23 NOTE — Patient Instructions (Addendum)
 VISIT SUMMARY: During your follow-up visit, we discussed your recurrent bronchitis, minimal obstructive lung disease, pulmonary nodule monitoring, past pneumonia, and an incidental gallstone. We reviewed your recent symptoms and outlined a plan to manage and monitor these conditions.  YOUR PLAN: -RECURRENT BRONCHITIS: Recurrent bronchitis means you have episodes of bronchitis, typically twice a year, often in winter. You recently had a raspy cough likely due to pollen. Continue using your nebulizer and guaifenesin  when symptoms start. Contact the clinic if symptoms persist or worsen.  -MINIMAL OBSTRUCTIVE LUNG DISEASE: Minimal obstructive lung disease means there is a slight blockage in your airways, which was noted in a previous breathing test. We will monitor this with another breathing test in six months unless your symptoms worsen.  -PULMONARY NODULE MONITORING: A pulmonary nodule is a small growth in the lung. Your 7 mm nodule is stable and low risk since you don't smoke. We will monitor it with a repeat CT scan in January 2027.  -PNEUMONIA: You were hospitalized for pneumonia in late November to December. Currently, you have no symptoms of recurrence. We discussed preventive measures to avoid future episodes.  -GALLSTONE WITHOUT SYMPTOMS: A gallstone is a small stone that forms in the gallbladder. You have a small gallstone but no symptoms like pain or nausea. We recommend dietary changes to avoid fatty foods to prevent symptoms.  INSTRUCTIONS: Please follow up with a repeat spirometry (breathing test) in six months to monitor your lung function. Additionally, schedule a CT scan in January 2027 to monitor the pulmonary nodule. Contact the clinic if your bronchitis symptoms persist or worsen.  Follow-up 6 months witth Beth NP/ 30 min spirometry prior     Cholelithiasis  Cholelithiasis happens when gallstones form in the gallbladder. The gallbladder stores bile. Bile is a fluid that  helps digest fats. Bile can harden and form into gallstones. If they cause a blockage, they can cause pain (gallbladder attack). What are the causes? This condition may be caused by: Too much bilirubin in the bile. This happens if you have sickle cell anemia. Too much of a fat-like substance (cholesterol) in your bile. Not enough bile salts in your bile. These salts help the body absorb and digest fats. The gallbladder not emptying fully or often enough. This is common in pregnant women. What increases the risk? The following factors may make you more likely to develop this condition: Being older than age 66. Eating a lot of fried foods, fat, and refined carbs (refined carbohydrates). Being female. Being pregnant many times. Using medicines with female hormones in them for a long time. Losing weight fast. Having gallstones in your family. Having health problems, such as diabetes, obesity, Crohn's disease, or liver disease. What are the signs or symptoms? Often, there may be gallstones but no symptoms. These gallstones are called silent gallstones. If a gallstone causes a blockage, you may get sudden pain. The pain: Can be in the upper right part of your belly (abdomen). Normally comes at night or after you eat. Can last an hour or more. Can spread to your right shoulder, back, or chest. Can feel like discomfort, burning, or fullness in the upper part of your belly (indigestion). If the blockage lasts more than a few hours, you can get an infection or swelling. You may: Vomit or feel like you may vomit (nauseous). Feel bloated. Have belly pain for 5 hours or more. Feel tender in your belly, often in the upper right part and under your ribs. Have a fever or  chills. Have skin or the white parts of your eyes turn yellow (jaundice). Have dark pee (urine) or pale poop (stool). How is this treated? Treatment for this condition depends on how bad you feel. If you have symptoms, you may  need: Home care, if symptoms are not very bad. Do not eat for 12-24 hours. Drink only water and clear liquids. After 1 or 2 days, start to eat simple or clear foods. Try broth and crackers. You may need medicines for pain or stomach upset or both. If you have an infection, you will need antibiotics. A hospital stay, if you have very bad pain or a very bad infection. Surgery to remove your gallbladder. You may need this if: Gallstones keep coming back. You have very bad symptoms. Medicines to break up gallstones. Medicines may be used for 6-12 months. A procedure to find and take out gallstones or to break up gallstones. Follow these instructions at home: Medicines Take over-the-counter and prescription medicines only as told by your doctor. If you were prescribed antibiotics, take them as told by your doctor. Do not stop taking them even if you start to feel better. Ask your doctor if you should avoid driving or using machines while you are taking your medicine. Eating and drinking Drink enough fluid to keep your pee pale yellow. Drink water or clear fluids. This is important when you have pain. Eat healthy foods. Choose: Fewer fatty foods, such as fried foods. Fewer refined carbs. Avoid breads and grains that are highly processed, such as white bread and white rice. Choose whole grains, such as whole-wheat bread and brown rice. More fiber. Almonds, fresh fruit, and beans are healthy sources. General instructions Keep a healthy weight. Keep all follow-up visits. You may need to see a specialist or a Careers adviser. Where to find more information [employer] of Diabetes and Digestive and Kidney Diseases: [email or web address] Contact a doctor if: You have sudden pain in the upper right part of your belly. Pain might spread to your right shoulder, back, or chest. Your pain lasts more than 2 hours. You have been diagnosed with gallstones that have no symptoms and you get: Belly  pain. Discomfort, burning, or fullness in the upper part of your abdomen. You keep feeling like you may vomit. You have dark pee or pale poop. Get help right away if: You have pain in your abdomen, that: Lasts more than 5 hours. Keeps getting worse. You have a fever or chills. You can't stop vomiting. Your skin or the white parts of your eyes turn yellow. This information is not intended to replace advice given to you by your health care provider. Make sure you discuss any questions you have with your health care provider. Document Revised: 12/10/2021 Document Reviewed: 12/10/2021 Elsevier Patient Education  2024 Elsevier Inc.Community-Acquired Pneumonia, Adult Pneumonia is a lung infection that causes inflammation and the buildup of mucus and fluids in the lungs. This may cause coughing and difficulty breathing. Community-acquired pneumonia is pneumonia that develops in people who are not, and have not recently been, in a hospital or other health care facility. Usually, pneumonia develops as a result of an illness that is caused by a virus, such as the common cold and the flu (influenza). It can also be caused by bacteria or fungi. While the common cold and influenza can pass from person to person (are contagious), pneumonia itself is not considered contagious. What are the causes? This condition may be caused by: Viruses. Bacteria. Fungi. What increases the  risk? The following factors may make you more likely to develop this condition: Being over age 75 or having certain medical conditions, such as: A long-term (chronic) disease, such as: chronic obstructive pulmonary disease (COPD), asthma, heart failure, diabetes, or kidney disease. A condition that increases the risk of breathing in (aspirating) mucus and other fluids from your mouth and nose. A weakened body defense system (immune system). Having had your spleen removed (splenectomy). The spleen is the organ that helps fight germs  and infections. Not cleaning your teeth and gums well (poor dental hygiene). Using tobacco products. Traveling to places where germs that cause pneumonia are present or being near certain animals or animal habitats that could have germs that cause pneumonia. What are the signs or symptoms? Symptoms of this condition include: A dry cough or a wet (productive) cough. A fever, sweating, or chills. Chest pain, especially when breathing deeply or coughing. Fast breathing, difficulty breathing, or shortness of breath. Tiredness (fatigue) and muscle aches. How is this diagnosed? This condition may be diagnosed based on your medical history or a physical exam. You may also have tests, including: Imaging, such as a chest X-ray or lung ultrasound. Tests of: The level of oxygen and other gases in your blood. Mucus from your lungs (sputum). Fluid around your lungs (pleural fluid). Your urine. How is this treated? Treatment for this condition depends on many factors, such as the cause of your pneumonia, your medicines, and other medical conditions that you have. For most adults, pneumonia may be treated at home. In some cases, treatment must happen in a hospital and may include: Medicines that are given by mouth (orally) or through an IV, including: Antibiotic medicines, if bacteria caused the pneumonia. Medicines that kill viruses (antiviral medicines), if a virus caused the pneumonia. Oxygen therapy. Severe pneumonia, although rare, may require the following treatments: Mechanical ventilation.This procedure uses a machine to help you breathe if you cannot breathe well on your own or maintain a safe level of blood oxygen. Thoracentesis. This procedure removes any buildup of pleural fluid to help with breathing. Follow these instructions at home:  Medicines Take over-the-counter and prescription medicines only as told by your health care provider. Take cough medicine only if you have trouble  sleeping. Cough medicine can prevent your body from removing mucus from your lungs. If you were prescribed antibiotics, take them as told by your health care provider. Do not stop taking the antibiotic even if you start to feel better. Lifestyle     Do not drink alcohol. Do not use any products that contain nicotine or tobacco. These products include cigarettes, chewing tobacco, and vaping devices, such as e-cigarettes. If you need help quitting, ask your health care provider. Eat a healthy diet. This includes plenty of vegetables, fruits, whole grains, low-fat dairy products, and lean protein. General instructions Rest a lot and get at least 8 hours of sleep each night. Sleep in a partly upright position at night. Place a few pillows under your head or sleep in a reclining chair. Return to your normal activities as told by your health care provider. Ask your health care provider what activities are safe for you. Drink enough fluid to keep your urine pale yellow. This helps to thin the mucus in your lungs. If your throat is sore, gargle with a mixture of salt and water 3-4 times a day or as needed. To make salt water, completely dissolve -1 tsp (3-6 g) of salt in 1 cup (237 mL) of  warm water. Keep all follow-up visits. How is this prevented? You can lower your risk of developing community-acquired pneumonia by: Getting the pneumonia vaccine. There are different types and schedules of pneumonia vaccines. Ask your health care provider which option is best for you. Consider getting the pneumonia vaccine if: You are older than 68 years of age. You are 31-68 years of age and are receiving cancer treatment, have chronic lung disease, or have other medical conditions that affect your immune system. Ask your health care provider if this applies to you. Getting your influenza vaccine every year. Ask your health care provider which type of vaccine is best for you. Getting regular dental  checkups. Washing your hands often with soap and water for at least 20 seconds. If soap and water are not available, use hand sanitizer. Contact a health care provider if: You have a fever. You have trouble sleeping because you cannot control your cough with cough medicine. Get help right away if: Your shortness of breath becomes worse. Your chest pain increases. Your sickness becomes worse, especially if you are an older adult or have a weak immune system. You cough up blood. These symptoms may be an emergency. Get help right away. Call 911. Do not wait to see if the symptoms will go away. Do not drive yourself to the hospital. Summary Pneumonia is an infection of the lungs. Community-acquired pneumonia develops in people who have not been in the hospital. It can be caused by bacteria, viruses, or fungi. This condition may be treated with antibiotics or antiviral medicines. Severe pneumonia may require a hospital stay and treatment to help with breathing. This information is not intended to replace advice given to you by your health care provider. Make sure you discuss any questions you have with your health care provider. Document Revised: 11/21/2022 Document Reviewed: 04/25/2021 Elsevier Patient Education  2024 [employer].

## 2023-08-26 ENCOUNTER — Encounter (HOSPITAL_COMMUNITY): Payer: Medicare Other

## 2023-09-04 ENCOUNTER — Other Ambulatory Visit: Payer: Self-pay | Admitting: Urology

## 2023-09-04 DIAGNOSIS — D3000 Benign neoplasm of unspecified kidney: Secondary | ICD-10-CM

## 2023-10-14 ENCOUNTER — Ambulatory Visit
Admission: RE | Admit: 2023-10-14 | Discharge: 2023-10-14 | Disposition: A | Discharge status: Home / Self Care | Source: Ambulatory Visit | Attending: Urology

## 2023-10-14 DIAGNOSIS — K760 Fatty (change of) liver, not elsewhere classified: Secondary | ICD-10-CM | POA: Diagnosis not present

## 2023-10-14 DIAGNOSIS — D3000 Benign neoplasm of unspecified kidney: Secondary | ICD-10-CM

## 2023-10-14 DIAGNOSIS — N2889 Other specified disorders of kidney and ureter: Secondary | ICD-10-CM | POA: Diagnosis not present

## 2023-10-14 MED ORDER — GADOPICLENOL 0.5 MMOL/ML IV SOLN
10.0000 mL | Freq: Once | INTRAVENOUS | Status: AC | PRN
  Administered 2023-10-14: 10 mL via INTRAVENOUS

## 2023-10-22 DIAGNOSIS — D485 Neoplasm of uncertain behavior of skin: Secondary | ICD-10-CM | POA: Diagnosis not present

## 2023-10-22 DIAGNOSIS — L570 Actinic keratosis: Secondary | ICD-10-CM | POA: Diagnosis not present

## 2023-11-28 DIAGNOSIS — D3000 Benign neoplasm of unspecified kidney: Secondary | ICD-10-CM | POA: Diagnosis not present

## 2023-11-28 DIAGNOSIS — N3020 Other chronic cystitis without hematuria: Secondary | ICD-10-CM | POA: Diagnosis not present

## 2024-01-16 ENCOUNTER — Telehealth: Payer: Self-pay

## 2024-01-16 NOTE — Telephone Encounter (Signed)
 Copied from CRM [id number]. Topic: Appointments - Scheduling Inquiry for Clinic >> Jan 16, 2024 12:01 PM Leila BROCKS wrote: Reason for CRM: Patient [phone] wants to schedule an appointment with NP, Hope. Informed patient, NP, Hope 07/23/23 advised 30 minutes spirometry prior 6 months. Patient states she did not do spirometry yet. Per CAL, NP, Hope is no longer in [place], only sees patient [place] and PFT at [facility] is booked until the end of January 2026. Patient states wants to stay in [place] with a different provider, and if a spirometry is needed since she will be seeing another provider before scheduling an appointment. Please advise.  How would you like me to proceed with this - pt requesting new dr in [place] and asking if she will need to complete pft before her consult as beth requested her to do

## 2024-01-21 NOTE — Telephone Encounter (Signed)
 Pt needs a toc appt with Wert

## 2024-01-21 NOTE — Telephone Encounter (Signed)
 Patient now scheduled 11/17 w/ Wert.

## 2024-01-26 ENCOUNTER — Encounter: Payer: Self-pay | Admitting: Internal Medicine

## 2024-01-26 ENCOUNTER — Ambulatory Visit: Admitting: Internal Medicine

## 2024-01-26 VITALS — BP 132/76 | HR 79 | Ht 65.0 in | Wt 229.0 lb

## 2024-01-26 DIAGNOSIS — R058 Other specified cough: Secondary | ICD-10-CM | POA: Insufficient documentation

## 2024-01-26 DIAGNOSIS — I10 Essential (primary) hypertension: Secondary | ICD-10-CM | POA: Diagnosis not present

## 2024-01-26 DIAGNOSIS — J449 Chronic obstructive pulmonary disease, unspecified: Secondary | ICD-10-CM

## 2024-01-26 MED ORDER — VALSARTAN-HYDROCHLOROTHIAZIDE 160-12.5 MG PO TABS: 1.0000 | ORAL_TABLET | Freq: Every day | ORAL | 11 refills | Status: AC

## 2024-01-26 NOTE — Patient Instructions (Addendum)
 Stop lisinopril  and start valsartan 160 - 12.5  one each am in its place    For > Mucinex  dm 1200 mg every 12 hours as needed    In the event of cough flare,  Try prilosec otc 20mg   Take 30-60 min before first meal of the day and Pepcid ac (famotidine) 20 mg one after supper  until cough is completely gone for at least a week without the need for cough suppression  Also keep some candy hand =  jolly ranchers, life savers and no chocalate or mints  If not better return in 6 weeks - if all better tell your PCP and friends!

## 2024-01-26 NOTE — Progress Notes (Signed)
 Melinda Stevens, female    DOB: March 08, 1956    MRN: [id number]   Brief patient profile:  24  yowf  never smoker exposed to smoking growing  bronchitis and exp in working  former [facility] patient  self-referred back to pulmonary clinic in [place]  01/26/2024  for chronic cough     History of Present Illness  01/26/2024  Pulmonary/ 1st office eval/ Emmilyn Crooke / [place] Office on ACEi Chief Complaint  Patient presents with   Medical Management of Chronic Issues   COPD    Overall doing well, occ cough with white sputum. She rarely uses her albuterol .   Dyspnea:  [place]ing [place] fast pace  Cough: laughing  Sleep: bed is flat/ one pillow  SABA use: once in a blue moon hfa / or nebulizer  02 ldz:wnwz     No obvious day to day or daytime pattern/variability or assoc excess/ purulent sputum or mucus plugs or hemoptysis or cp or chest tightness, subjective wheeze or overt sinus or hb symptoms.    Also denies any obvious fluctuation of symptoms with weather or environmental changes or other aggravating or alleviating factors except as outlined above   No unusual exposure hx or h/o childhood pna/ asthma or knowledge of premature birth.  Current Allergies, Complete Past Medical History, Past Surgical History, Family History, and Social History were reviewed in [employer] record.  ROS  The following are not active complaints unless bolded Hoarseness, sore throat, dysphagia, dental problems, itching, sneezing,  nasal congestion or discharge of excess mucus or purulent secretions, ear ache,   fever, chills, sweats, unintended wt loss or wt gain, classically pleuritic or exertional cp,  orthopnea pnd or arm/hand swelling  or leg swelling, presyncope, palpitations, abdominal pain, anorexia, nausea, vomiting, diarrhea  or change in bowel habits or change in bladder habits, change in stools or change in urine, dysuria, hematuria,  rash, arthralgias, visual  complaints, headache, numbness, weakness or ataxia or problems with walking or coordination,  change in mood or  memory.            Outpatient Medications Prior to Visit  Medication Sig Dispense Refill   albuterol  (VENTOLIN  HFA) 108 (90 Base) MCG/ACT inhaler Inhale 1-2 puffs into the lungs every 4 (four) hours as needed for wheezing or shortness of breath. 18 g 0   amLODipine (NORVASC) 2.5 MG tablet Take 2.5 mg by mouth daily.     clobetasol (TEMOVATE) 0.05 % external solution Apply 1 Application topically daily.     finasteride (PROSCAR) 5 MG tablet Take 2.5 mg by mouth daily.     ipratropium-albuterol  (DUONEB) 0.5-2.5 (3) MG/3ML SOLN Take 3 mLs by nebulization every 4 (four) hours as needed. 720 mL 0   lisinopril -hydrochlorothiazide  (PRINZIDE ,ZESTORETIC ) 20-12.5 MG tablet Take 2 tablets by mouth daily.     Multiple Vitamin (MULTIVITAMIN WITH MINERALS) TABS tablet Take 1 tablet by mouth daily.     Multiple Vitamins-Minerals (ICAPS) TABS Take 1 tablet by mouth daily.     OZEMPIC, 1 MG/DOSE, 4 MG/3ML SOPN Inject 1 mg into the skin once a week. Wednesdays     rosuvastatin  (CRESTOR ) 20 MG tablet Take 20 mg by mouth every evening.     guaiFENesin -dextromethorphan  (ROBITUSSIN DM) 100-10 MG/5ML syrup Take 5 mLs by mouth every 4 (four) hours as needed for cough. 240 mL 0   No facility-administered medications prior to visit.    Past Medical History:  Diagnosis Date  Diverticulitis    Hypertension    Renal disorder       Objective:     BP 132/76   Pulse 79   Ht 5' 5 (1.651 m) Comment: per pt  Wt 229 lb (103.9 kg)   SpO2 96% Comment: on RA  BMI 38.11 kg/m   SpO2: 96 % (on RA) pleasant amb wf nad/ harsh cough p laughing    HEENT : Oropharynx  clea      Nasal turbinates nl    NECK :  without  apparent JVD/ palpable Nodes/TM    LUNGS: no acc muscle use,  Nl contour chest which is clear to A and P bilaterally without cough on insp or exp maneuvers   CV:  RRR  no s3 or murmur  or increase in P2, and no edema   ABD:  soft and nontender   MS:  Gait nl   ext warm without deformities Or obvious joint restrictions  calf tenderness, cyanosis or clubbing    SKIN: warm and dry without lesions    NEURO:  alert, approp, nl sensorium with  no motor or cerebellar deficits apparent.     I personally reviewed images and agree with radiology impression as follows:  CXR:   pa and lateral 2//17/25  No active dz       Assessment   Assessment & Plan Obstructive lung disease (generalized) (HCC)  Upper airway cough syndrome Onset of recurrent bronchitis with only passive cig exp  - trial off acei 01/26/2024 plus short term gerd rx   Upper airway cough syndrome (previously labeled PNDS),  is so named because it's frequently impossible to sort out how much is  CR/sinusitis with freq throat clearing (which can be related to primary GERD)   vs  causing  secondary ( extra esophageal)  GERD from wide swings in gastric pressure that occur with throat clearing, often  promoting self use of mint and menthol lozenges that reduce the lower esophageal sphincter tone and exacerbate the problem further in a cyclical fashion.   These are the same pts (now being labeled as having irritable larynx syndrome by some cough centers) who not infrequently have a history of having failed to tolerate ace inhibitors(likely the case here),  dry powder inhalers or biphosphonates or report having atypical/extraesophageal reflux symptoms from LPR (globus, throat clearing which may be the case here)  that don't respond to standard doses of PPI  and are easily confused as having aecopd or asthma flares by even experienced allergists/ pulmonologists (myself included).   >>> gerd rx/ short term  otc's for cough not responding to mucinex  dm and off acei   >>> f/u 6 weeks prn    Essential hypertension D/c acei 01/26/2024 due to recurrent bronchitis severe with cough with laughing in between   In  the best review of chronic cough to date ( NEJM 2016 375 8455-8448) ,  ACEi are now felt to cause cough in up to  20% of pts which is a 4 fold increase from previous reports and does not include the variety of non-specific complaints we see in pulmonary clinic in pts on ACEi but previously attributed to another dx like  Copd/asthma and  include PNDS, throat and chest congestion, bronchitis, unexplained dyspnea and noct strangling sensations, and hoarseness, but also  atypical /refractory GERD symptoms like dysphagia and bad heartburn   The only way I know  to prove this is not an ACEi Case is a trial off ACEi  x a minimum of 6 weeks then regroup.    >>> valsartan 160 -12.5 one each am and f/u with pcp in a week as planned, here as needed    Discussed in detail all the  indications, usual  risks and alternatives  relative to the benefits with patient who agrees to proceed with Rx as outlined.             Each maintenance medication was reviewed in detail including emphasizing most importantly the difference between maintenance and prns and under what circumstances the prns are to be triggered using an action plan format where appropriate.  Total time for H and P, chart review, counseling,  and generating customized AVS unique to this office visit / same day charting = 40 min  with pt new to me           AVS  Patient Instructions  Stop lisinopril  and start valsartan 160 - 12.5  one each am in its place    For > Mucinex  dm 1200 mg every 12 hours as needed    In the event of cough flare,  Try prilosec otc 20mg   Take 30-60 min before first meal of the day and Pepcid ac (famotidine) 20 mg one after supper  until cough is completely gone for at least a week without the need for cough suppression  Also keep some candy hand =  jolly ranchers, life savers and no chocalate or mints  If not better return in 6 weeks - if all better tell your PCP and friends!         Ozell America,  MD 01/26/2024

## 2024-01-26 NOTE — Assessment & Plan Note (Addendum)
 D/c acei 01/26/2024 due to recurrent bronchitis severe with cough with laughing in between   In the best review of chronic cough to date ( NEJM 2016 375 [id number]) ,  ACEi are now felt to cause cough in up to  20% of pts which is a 4 fold increase from previous reports and does not include the variety of non-specific complaints we see in pulmonary clinic in pts on ACEi but previously attributed to another dx like  Copd/asthma and  include PNDS, throat and chest congestion, bronchitis, unexplained dyspnea and noct strangling sensations, and hoarseness, but also  atypical /refractory GERD symptoms like dysphagia and bad heartburn   The only way I know  to prove this is not an ACEi Case is a trial off ACEi x a minimum of 6 weeks then regroup.    >>> valsartan 160 -12.5 one each am and f/u with pcp in a week as planned, here as needed    Discussed in detail all the  indications, usual  risks and alternatives  relative to the benefits with patient who agrees to proceed with Rx as outlined.             Each maintenance medication was reviewed in detail including emphasizing most importantly the difference between maintenance and prns and under what circumstances the prns are to be triggered using an action plan format where appropriate.  Total time for H and P, chart review, counseling,  and generating customized AVS unique to this office visit / same day charting = 40 min  with pt new to me

## 2024-01-26 NOTE — Assessment & Plan Note (Addendum)
 Onset of recurrent bronchitis with only passive cig exp  - trial off acei 01/26/2024 plus short term gerd rx   Upper airway cough syndrome (previously labeled PNDS),  is so named because it's frequently impossible to sort out how much is  CR/sinusitis with freq throat clearing (which can be related to primary GERD)   vs  causing  secondary ( extra esophageal)  GERD from wide swings in gastric pressure that occur with throat clearing, often  promoting self use of mint and menthol lozenges that reduce the lower esophageal sphincter tone and exacerbate the problem further in a cyclical fashion.   These are the same pts (now being labeled as having irritable larynx syndrome by some cough centers) who not infrequently have a history of having failed to tolerate ace inhibitors(likely the case here),  dry powder inhalers or biphosphonates or report having atypical/extraesophageal reflux symptoms from LPR (globus, throat clearing which may be the case here)  that don't respond to standard doses of PPI  and are easily confused as having aecopd or asthma flares by even experienced allergists/ pulmonologists (myself included).   >>> gerd rx/ short term  otc's for cough not responding to mucinex  dm and off acei   >>> f/u 6 weeks prn
